# Patient Record
Sex: Female | Born: 1935
Health system: Southern US, Community
[De-identification: ages and names within clinical notes are randomized; demographics above are authoritative.]

## PROBLEM LIST (undated history)

## (undated) ENCOUNTER — Ambulatory Visit: Admission: EM | Payer: BLUE CROSS/BLUE SHIELD

## (undated) DIAGNOSIS — E119 Type 2 diabetes mellitus without complications: Secondary | ICD-10-CM

## (undated) DIAGNOSIS — E7800 Pure hypercholesterolemia, unspecified: Secondary | ICD-10-CM

## (undated) HISTORY — PX: ABDOMINAL HYSTERECTOMY: SHX81

---

## 1998-09-18 ENCOUNTER — Other Ambulatory Visit: Admission: RE | Admit: 1998-09-18 | Discharge: 1998-09-18 | Payer: Self-pay | Admitting: Obstetrics and Gynecology

## 2000-11-05 ENCOUNTER — Other Ambulatory Visit: Admission: RE | Admit: 2000-11-05 | Discharge: 2000-11-05 | Payer: Self-pay | Admitting: Obstetrics and Gynecology

## 2000-11-25 ENCOUNTER — Encounter: Admission: RE | Admit: 2000-11-25 | Discharge: 2000-11-25 | Payer: Self-pay | Admitting: General Surgery

## 2000-11-25 ENCOUNTER — Encounter (HOSPITAL_BASED_OUTPATIENT_CLINIC_OR_DEPARTMENT_OTHER): Payer: Self-pay | Admitting: General Surgery

## 2000-12-08 ENCOUNTER — Encounter: Admission: RE | Admit: 2000-12-08 | Discharge: 2001-03-08 | Payer: Self-pay | Admitting: Obstetrics and Gynecology

## 2001-02-03 ENCOUNTER — Ambulatory Visit (HOSPITAL_COMMUNITY): Admission: RE | Admit: 2001-02-03 | Discharge: 2001-02-03 | Payer: Self-pay | Admitting: Gastroenterology

## 2002-01-11 ENCOUNTER — Encounter: Admission: RE | Admit: 2002-01-11 | Discharge: 2002-01-11 | Payer: Self-pay | Admitting: Obstetrics and Gynecology

## 2002-01-11 ENCOUNTER — Encounter: Payer: Self-pay | Admitting: Obstetrics and Gynecology

## 2002-02-09 ENCOUNTER — Other Ambulatory Visit: Admission: RE | Admit: 2002-02-09 | Discharge: 2002-02-09 | Payer: Self-pay | Admitting: Obstetrics and Gynecology

## 2002-06-20 ENCOUNTER — Encounter: Payer: Self-pay | Admitting: Emergency Medicine

## 2002-06-20 ENCOUNTER — Inpatient Hospital Stay (HOSPITAL_COMMUNITY): Admission: EM | Admit: 2002-06-20 | Discharge: 2002-06-21 | Payer: Self-pay | Admitting: Cardiology

## 2003-03-28 ENCOUNTER — Encounter: Admission: RE | Admit: 2003-03-28 | Discharge: 2003-03-28 | Payer: Self-pay | Admitting: Unknown Physician Specialty

## 2003-03-28 ENCOUNTER — Encounter: Payer: Self-pay | Admitting: Unknown Physician Specialty

## 2004-03-21 ENCOUNTER — Encounter: Admission: RE | Admit: 2004-03-21 | Discharge: 2004-03-21 | Payer: Self-pay | Admitting: Internal Medicine

## 2004-04-01 ENCOUNTER — Encounter: Admission: RE | Admit: 2004-04-01 | Discharge: 2004-04-01 | Payer: Self-pay | Admitting: Internal Medicine

## 2005-04-01 ENCOUNTER — Other Ambulatory Visit: Admission: RE | Admit: 2005-04-01 | Discharge: 2005-04-01 | Payer: Self-pay | Admitting: Internal Medicine

## 2005-05-20 ENCOUNTER — Encounter: Admission: RE | Admit: 2005-05-20 | Discharge: 2005-05-20 | Payer: Self-pay | Admitting: Internal Medicine

## 2006-07-01 ENCOUNTER — Encounter: Admission: RE | Admit: 2006-07-01 | Discharge: 2006-07-01 | Payer: Self-pay | Admitting: Internal Medicine

## 2007-08-19 ENCOUNTER — Encounter: Admission: RE | Admit: 2007-08-19 | Discharge: 2007-08-19 | Payer: Self-pay | Admitting: Internal Medicine

## 2007-09-18 ENCOUNTER — Emergency Department (HOSPITAL_COMMUNITY): Admission: EM | Admit: 2007-09-18 | Discharge: 2007-09-18 | Payer: Self-pay | Admitting: Emergency Medicine

## 2008-09-11 ENCOUNTER — Encounter: Admission: RE | Admit: 2008-09-11 | Discharge: 2008-09-11 | Payer: Self-pay | Admitting: Internal Medicine

## 2008-10-30 ENCOUNTER — Encounter (HOSPITAL_COMMUNITY): Admission: RE | Admit: 2008-10-30 | Discharge: 2009-01-28 | Payer: Self-pay | Admitting: Internal Medicine

## 2009-09-19 ENCOUNTER — Encounter: Admission: RE | Admit: 2009-09-19 | Discharge: 2009-09-19 | Payer: Self-pay | Admitting: Internal Medicine

## 2010-07-13 ENCOUNTER — Encounter: Payer: Self-pay | Admitting: Family Medicine

## 2010-10-08 ENCOUNTER — Other Ambulatory Visit: Payer: Self-pay | Admitting: Internal Medicine

## 2010-10-08 DIAGNOSIS — R928 Other abnormal and inconclusive findings on diagnostic imaging of breast: Secondary | ICD-10-CM

## 2010-10-08 DIAGNOSIS — Z1231 Encounter for screening mammogram for malignant neoplasm of breast: Secondary | ICD-10-CM

## 2010-10-23 ENCOUNTER — Ambulatory Visit
Admission: RE | Admit: 2010-10-23 | Discharge: 2010-10-23 | Disposition: A | Discharge status: Home / Self Care | Payer: Medicare Other | Source: Ambulatory Visit | Attending: Internal Medicine | Admitting: Internal Medicine

## 2010-10-23 DIAGNOSIS — Z1231 Encounter for screening mammogram for malignant neoplasm of breast: Secondary | ICD-10-CM

## 2010-11-08 NOTE — Cardiovascular Report (Signed)
   NAME:  Megan Wolfe, Megan Wolfe                      ACCOUNT NO.:  [id number]   MEDICAL RECORD NO.:  [id number]                   PATIENT TYPE:  INP   LOCATION:  2926                                 FACILITY:  [facility]   PHYSICIAN:  Salvadore Farber, M.D. [facility]         DATE OF BIRTH:  11-10-1935   DATE OF PROCEDURE:  06/20/2002  DATE OF DISCHARGE:                              CARDIAC CATHETERIZATION   PROCEDURE:  Left heart catheterization, left ventriculography and coronary  angiography.   INDICATIONS FOR PROCEDURE:  The patient is a 75 year-old lady without a  history of prior cardiac disease who presented to Urgent Care today with a  complaint of chest pain.  Electrocardiogram demonstrated anterolateral ST  elevations.  She was transferred to [facility] where she was  treated with nitroglycerin and heparin with relief of her chest pain and ST  elevations.  She has remained pain free since then.  She was transferred for  urgent diagnostic angiography with possible intervention.   DIAGNOSTIC TECHNIQUE:  Informed consent was obtained.  Under 1% lidocaine  local anesthesia, a 6 French sheath was placed in the right femoral artery  using the modified Seldinger technique.  Diagnostic angiography and  ventriculography were performed using JL4, JR4 and straight pigtail  catheters.  The patient tolerated the procedure well and was transferred to  the holding room in stable condition.  She is to have the sheath removed  there.   COMPLICATIONS:  None.   FINDINGS:  1. Left ventricle:  Ejection fraction 65% without regional wall motion     abnormality.  137/8/16 after the administration of contrast.  2. No aortic stenosis or mitral regurgitation.  3. Left main:  Angiographically normal.  4. Left anterior descending:  Moderate sized vessel giving rise to a single     diagonal branch.  It is angiographically normal.  5. Circumflex:  Large vessel giving rise to two principal obtuse  marginal     branches.  It is angiographically normal.  6. Right coronary artery:  Dominant vessel which is angiographically normal.   IMPRESSION/PLAN:  Angiographically normal coronary arteries.  Normal left  ventricular size and systolic function.  The chest pain and transient ST  elevations are probably explained by coronary arterial spasm.  We will  therefore initiate a calcium channel blocker and chronic nitrates.  Serial  enzymes will be cycled.                                               Salvadore Farber, M.D. [facility]    WED/MEDQ  D:  06/20/2002  T:  06/20/2002  Job:  [id number]

## 2010-11-08 NOTE — Procedures (Signed)
[facility]  Patient:    Megan Wolfe, Megan Wolfe                   MRN: [id number] Proc. Date: 02/03/01 Adm. Date:  [id number] Attending:  Charna Elizabeth CC:         Sheronette A. Cherly Hensen, M.D.   Procedure Report  DATE OF BIRTH:  1936/02/22.  PROCEDURE:  Colonoscopy.  ENDOSCOPIST:  Anselmo Rod, M.D.  INSTRUMENT USED:  Olympus video colonoscope.  INDICATION FOR PROCEDURE:  Blood in stool with a history of constipation in a 75 year old African-American female.  Rule out colonic polyps, masses, hemorrhoids, etc.  PREPROCEDURE PREPARATION:  Informed consent was procured from the patient. The patient was fasted for eight hours prior to the procedure and prepped with a bottle of magnesium citrate and a gallon of NuLytely the night prior to the procedure.  PREPROCEDURE PHYSICAL:  VITAL SIGNS:  The patient had stable vital signs.  NECK:  Supple.  CHEST:  Clear to auscultation.  S1, S2 regular.  ABDOMEN:  Soft with normal bowel sounds.  DESCRIPTION OF PROCEDURE:  The patient was placed in the left lateral decubitus position and sedated with 50 mg of Demerol and 5 mg of Versed intravenously.  Once the patient was adequately sedate and maintained on low-flow oxygen and continuous cardiac monitoring, the Olympus video colonoscope was advanced from the rectum to the cecum.  Except for a few left-sided diverticula and melanosis coli throughout the colon with more prominent change in the right colon, no other abnormalities were seen.  No masses or polyps were present.  The patient tolerated the procedure well without complications.  IMPRESSION: 1. Left-sided diverticulosis. 2. Diffuse melanosis coli in the right colon, transverse colon, and    proximal left colon. 3. No masses or polyps seen.  RECOMMENDATIONS: 1. High-fiber diet. 2. Outpatient follow-up in the next four weeks. DD:  02/03/01 TD:  02/03/01 Job: [id number]

## 2010-11-08 NOTE — Discharge Summary (Signed)
NAME:  Megan Wolfe, Megan Wolfe                      ACCOUNT NO.:  [id number]   MEDICAL RECORD NO.:  [id number]                   PATIENT TYPE:  INP   LOCATION:  2033                                 FACILITY:  [facility]   PHYSICIAN:  Joellyn Rued, P.A. [facility]              DATE OF BIRTH:  1935/06/25   DATE OF ADMISSION:  06/20/2002  DATE OF DISCHARGE:                           DISCHARGE SUMMARY - REFERRING   SUMMARY OF HISTORY:  [name] is a 75 year old, black female who went  to [facility] because she was having intermittent chest discomfort for the  preceding four days.  She was sent to [facility] where an EKG revealed ST  segment elevation laterally and T-wave changes in the anterior leads.  These  changes resolved with Heparin and nitroglycerin.  Her history is notable for  history of hyperlipidemia, currently untreated.   LABORATORY DATA:  Admission H&H was 13.4 and 39.1, normal indices.  Platelets 239, WBC 7.4.  On the 30th, H&H were 11.6 and 34.0, normal  indices, platelets 252, WBC 4.5.  On the 29th, sodium was 141, potassium  4.2, BUN 15, creatinine 1.0, glucose 114.  On the 30th, essentially remained  unchanged.  Serial of four CKs and three troponins were negative for  myocardial infarction, BMP was 44.2.  EKG showed sinus bradycardia,  nonspecific T-wave changes.  She does have approximately 0.5 to 1 mm ST  segment elevation in V4 through V6, this is possibly baseline for her.   HOSPITAL COURSE:  [name] was taken emergently to the catheterization  laboratory for possible myocardial infarction.  Dr. Samule Ohm performed left  heart cath via the right femoral artery on 06/20/02.  Cardiac  catheterization did not show any coronary artery disease.  Her EF was 65%  without wall motion abnormality.  She did not have any AF or MR.  Dr. Samule Ohm  notes that she has possible spasm accounting for chest discomfort and ST  wave changes.  Her sheath remained in with bed rest,  catheterization site  was intact.  Serial CK titer and MBs were negative for myocardial  infarction.  After review on 12/30, Dr. Andee Lineman felt that she could be  discharged home and treated for spasm.   DISCHARGE DIAGNOSES:  1. Chest discomfort, possibly related to coronary spasm.  2. Normocytic anemia, post catheterization.  3. History as previously.   DISPOSITION:  She is discharged to home.  Her new medications include  Cardizem CD 120 q.d., Imdur 30 q.d., aspirin 81 q.d. and sublingual  nitroglycerin, 0.4 as needed.  She is advised no lifting, driving, sexual  activity or heavy exertion for two days.  Maintain low salt, fat,  cholesterol diet.  If she has any problems with her catheterization site,  she was asked to call.  She will see Dr. Andee Lineman back in the [facility]  office on January 20 at 2:00 p.m.  Joellyn Rued, P.A. [facility]    EW/MEDQ  D:  06/21/2002  T:  06/21/2002  Job:  [id number]   cc:   Learta Codding, M.D. [facility]

## 2010-11-08 NOTE — H&P (Signed)
NAME:  Megan Wolfe, Megan Wolfe                      ACCOUNT NO.:  [id number]   MEDICAL RECORD NO.:  [id number]                   PATIENT TYPE:  INP   LOCATION:  2926                                 FACILITY:  [facility]   PHYSICIAN:  Learta Codding, M.D. [facility]             DATE OF BIRTH:  Nov 02, 1935   DATE OF ADMISSION:  06/20/2002  DATE OF DISCHARGE:                                HISTORY & PHYSICAL   PRIMARY CARE PHYSICIAN:  [facility] on [address]   CURRENT COMPLAINTS:  Substernal chest pain since Friday.   HISTORY OF PRESENT ILLNESS:  Megan Wolfe is a 75 year old African American  female with a history of hyperlipidemia and hypertension and was admitted  with the onset of substernal chest pain.  The patient reports chest pain  since last Friday.  She initially reported it was left sided with tight  sensation.  There was no associated shortness of breath, diaphoresis or  nausea.  However yesterday, particularly with exertion, the patient felt  pain radiating down the left arm.  Today she had longer episodes of  substernal chest pain and left arm pain and presents to the emergency room.  She was found to have S-T segment changes on the 12 channel  electrocardiogram particularly S-T elevation in leads V2, V3, V4, V5 and V6.  After nitroglycerin some of these changes resolved particularly leads V2 and  V3 and there were still changes in V4, V5 and V6.  These appeared to  represent early repolarization.  The patient is currently pain free.  Initial troponin was within normal limits.   ALLERGIES:  NO KNOWN DRUG ALLERGIES.   PAST MEDICAL HISTORY:  No prior cardiac history, no diabetes, no underlying  lung disease.   SOCIAL HISTORY:  Documented in PA notes.  The patient does not smoke.  She  does not drink alcohol.   FAMILY HISTORY:  Documented in PA notes.   REVIEW OF SYSTEMS:  As per History of Present Illness no nausea or vomiting,  no fever, chills, no orthopnea, paroxysmal  nocturnal dyspnea, no  palpitations or syncope, no dysuria or frequency.   PHYSICAL EXAMINATION:  VITAL SIGNS:  Blood pressure 137/84, heart rate 110  beats per minute, now 57 beats per minute, saturation 99%.  GENERAL:  Well-nourished African American female in no apparent distress.  HEENT:  Pupils ___________, conjunctiva clear.  NECK:  Supple, normal clear upstroke, no carotid bruits.  LUNGS:  Crackles bilaterally approximately one third up, but more sounding  like Velcro rales.  HEART:  Regular rate and rhythm, normal S1, S2, no murmurs, rubs or gallops.  ABDOMEN:  Soft, nontender, no rebound or guarding.  EXTREMITIES:  Two plus pedal pulses, there is no cyanosis, clubbing or  edema.  NEUROLOGIC:  Patient alert and oriented, grossly nonfocal.   LABORATORY DATA:  Sodium 141, potassium 4.4, chloride 112, glucose 114, BUN  15, creatinine 1.0, CPK is 237, troponin  0.02, white count 7.4, hemoglobin  13.4, hematocrit 39.1, ___________ 239.  INR pending.  Chest x-ray - mild  cardiomegaly, increased interstitial changes probably interstitial lung  disease, cannot rule out pulmonary vascular congestion.   IMPRESSION:  1. Substernal chest pain with typical features for acute coronary syndrome.     The patient's pain was associated with S-T segment changes in the     inferior precordial leads and resolved with nitroglycerin.  She was given     aspirin and Plavix as well as IV heparin.  The plan is to proceed with     early cardiac catheterization which will probably be done later today.     The patient will also be started on very low dose beta blocker.  2. Rule out hyperlipidemia.  Lipid panel will be drawn.  3. Rule out metabolic _____________, hemoglobin A1c will be checked.  4. Cardiac catheterization later today.  5. Rule out interstitial lung disease.  Check basic metabolic profile and     repeat chest x-ray, PA and lateral prior  to hospital discharge.  I do     not think patient is  in congestive heart failure but she does have, on     examination, evidence of Velcro rales and could have underlying lung     disease.                                               Learta Codding, M.D. [facility]    GED/MEDQ  D:  06/20/2002  T:  06/20/2002  Job:  [id number]

## 2011-07-14 DIAGNOSIS — K5730 Diverticulosis of large intestine without perforation or abscess without bleeding: Secondary | ICD-10-CM | POA: Diagnosis not present

## 2011-07-14 DIAGNOSIS — Z1211 Encounter for screening for malignant neoplasm of colon: Secondary | ICD-10-CM | POA: Diagnosis not present

## 2011-07-14 DIAGNOSIS — D126 Benign neoplasm of colon, unspecified: Secondary | ICD-10-CM | POA: Diagnosis not present

## 2011-07-16 DIAGNOSIS — J309 Allergic rhinitis, unspecified: Secondary | ICD-10-CM | POA: Diagnosis not present

## 2011-11-26 DIAGNOSIS — M810 Age-related osteoporosis without current pathological fracture: Secondary | ICD-10-CM | POA: Diagnosis not present

## 2011-11-26 DIAGNOSIS — E782 Mixed hyperlipidemia: Secondary | ICD-10-CM | POA: Diagnosis not present

## 2012-01-14 ENCOUNTER — Other Ambulatory Visit: Payer: Self-pay | Admitting: Internal Medicine

## 2012-01-14 DIAGNOSIS — Z1231 Encounter for screening mammogram for malignant neoplasm of breast: Secondary | ICD-10-CM

## 2012-01-29 ENCOUNTER — Ambulatory Visit
Admission: RE | Admit: 2012-01-29 | Discharge: 2012-01-29 | Disposition: A | Discharge status: Home / Self Care | Payer: 59 | Source: Ambulatory Visit | Attending: Internal Medicine | Admitting: Internal Medicine

## 2012-01-29 DIAGNOSIS — Z1231 Encounter for screening mammogram for malignant neoplasm of breast: Secondary | ICD-10-CM

## 2012-05-05 ENCOUNTER — Emergency Department (HOSPITAL_COMMUNITY)
Admission: EM | Admit: 2012-05-05 | Discharge: 2012-05-05 | Disposition: A | Discharge status: Home / Self Care | Payer: Medicare Other | Attending: Emergency Medicine | Admitting: Emergency Medicine

## 2012-05-05 ENCOUNTER — Emergency Department (HOSPITAL_COMMUNITY): Payer: Medicare Other

## 2012-05-05 ENCOUNTER — Encounter (HOSPITAL_COMMUNITY): Payer: Self-pay | Admitting: Emergency Medicine

## 2012-05-05 DIAGNOSIS — E119 Type 2 diabetes mellitus without complications: Secondary | ICD-10-CM | POA: Diagnosis not present

## 2012-05-05 DIAGNOSIS — Y929 Unspecified place or not applicable: Secondary | ICD-10-CM | POA: Insufficient documentation

## 2012-05-05 DIAGNOSIS — S62639A Displaced fracture of distal phalanx of unspecified finger, initial encounter for closed fracture: Secondary | ICD-10-CM | POA: Diagnosis not present

## 2012-05-05 DIAGNOSIS — Z79899 Other long term (current) drug therapy: Secondary | ICD-10-CM | POA: Insufficient documentation

## 2012-05-05 DIAGNOSIS — W230XXA Caught, crushed, jammed, or pinched between moving objects, initial encounter: Secondary | ICD-10-CM | POA: Insufficient documentation

## 2012-05-05 DIAGNOSIS — S6980XA Other specified injuries of unspecified wrist, hand and finger(s), initial encounter: Secondary | ICD-10-CM | POA: Diagnosis not present

## 2012-05-05 DIAGNOSIS — Y939 Activity, unspecified: Secondary | ICD-10-CM | POA: Insufficient documentation

## 2012-05-05 DIAGNOSIS — S6990XA Unspecified injury of unspecified wrist, hand and finger(s), initial encounter: Secondary | ICD-10-CM | POA: Diagnosis not present

## 2012-05-05 HISTORY — DX: Type 2 diabetes mellitus without complications: E11.9

## 2012-05-05 MED ORDER — ACETAMINOPHEN 500 MG PO TABS: 500.0000 mg | ORAL_TABLET | Freq: Four times a day (QID) | ORAL | Status: DC | PRN

## 2012-05-05 NOTE — ED Notes (Signed)
Patient transported to X-ray 

## 2012-05-05 NOTE — ED Notes (Signed)
Pt states that she jammed her left pinky finger last week and it has been throbbing at night ever since.  Pt states that she is not really in pain right now but during the night, it throbs.  Pt is able to move pinky finger freely with no problems.

## 2012-05-05 NOTE — ED Provider Notes (Signed)
Medical screening examination/treatment/procedure(s) were conducted as a shared visit with non-physician practitioner(s) and myself.  I personally evaluated the patient during the encounter  Finger placed in splint, referrals given  Toy Baker, MD 05/05/12 [phone]

## 2012-05-05 NOTE — ED Provider Notes (Signed)
History     CSN: [id number]  Arrival date & time 05/05/12  1345   First MD Initiated Contact with Patient 05/05/12 1512      Chief Complaint  Patient presents with  . Hand Pain    (Consider location/radiation/quality/duration/timing/severity/associated sxs/prior treatment) HPI  76 year old female with history of diabetes presents complaining of left pinky finger injury. Patient reports she accidentally jammed her left pinky finger 8-10 days ago.  Was having pain to the joint of finger which has improved.  Onset was acute, moderate in severity, non radiating, worsening with movement and improves with inactivity.  Pain worse at night when it throbs.  She has tried buddy taped it.  She is able move her finger but has noticed that she can't fully extend her finger.  Denies numbness, rash, or bleeding.  No other injury.    Past Medical History  Diagnosis Date  . Diabetes mellitus without complication     No past surgical history on file.  No family history on file.  History  Substance Use Topics  . Smoking status: Never Smoker   . Smokeless tobacco: Not on file  . Alcohol Use: No    OB History    Grav Para Term Preterm Abortions TAB SAB Ect Mult Living                  Review of Systems  Constitutional: Negative for fever.  Musculoskeletal: Positive for arthralgias.  Skin: Negative for rash and wound.  Neurological: Negative for numbness.    Allergies  Review of patient's allergies indicates no known allergies.  Home Medications   Current Outpatient Rx  Name  Route  Sig  Dispense  Refill  . APPLE CIDER VINEGAR PO   Oral   Take 2 tablets by mouth 2 (two) times daily.         Marland Kitchen METFORMIN HCL PO   Oral   Take 500 mg by mouth daily.         . RED YEAST RICE PO   Oral   Take 1 tablet by mouth daily.         . TETRAHYDROZOLINE HCL 0.05 % OP SOLN   Both Eyes   Place 1 drop into both eyes as needed. For dry eyes.           BP 140/73  Pulse 60   Temp 98.5 F (36.9 C) (Oral)  Resp 18  SpO2 100%  Physical Exam  Nursing note and vitals reviewed. Constitutional: She appears well-developed and well-nourished. No distress.  HENT:  Head: Atraumatic.  Eyes: Conjunctivae normal are normal.  Neck: Neck supple.  Musculoskeletal: She exhibits tenderness (L hand: left pinky finger with point tenderness to DIP.  Finger is mildly flexed at DIP.  No deformity noted.  Normal finger flexion but decreased extension.  Brisk cap refill, normal sensation to light touch).  Neurological: She is alert.  Skin: Skin is warm. No rash noted.  Psychiatric: She has a normal mood and affect.    ED Course  Procedures (including critical care time)   No results found for this or any previous visit. Dg Finger Little Left  05/05/2012  *RADIOLOGY REPORT*  Clinical Data: Jammed finger one half weeks ago  LEFT LITTLE FINGER 2+V  Comparison: None.  Findings: There is an avulsion fracture from the posterior aspect of the fifth distal phalanx with mild displacement.  No other fracture is seen.  No significant soft tissue abnormality is noted.  IMPRESSION: Avulsion fracture  from the fifth distal phalanx.   Original Report Authenticated By: Alcide Clever, M.D.     1. Avulsion fx of the fifth distal phalanx on left hand  MDM  Pt jammed her L pinky finger over a week ago.  Pt presents with what appears to be a swan neck deformity concerns for extensor tendon injury vs. fx.  Will order xray.  Otherwise no significant pain.     3:51 PM Xray shows an avulsion fx of the fifth distal phalanx on L hand.  Will apply finger splint, hand surgery referral.  Discussed care with my attending.     BP 140/73  Pulse 60  Temp 98.5 F (36.9 C) (Oral)  Resp 18  SpO2 100%  I have reviewed nursing notes and vital signs. I personally reviewed the imaging tests through PACS system  I reviewed available ER/hospitalization records thought the EMR    Fayrene Helper, [place] 05/05/12  1611

## 2012-05-06 NOTE — ED Provider Notes (Signed)
Medical screening examination/treatment/procedure(s) were performed by non-physician practitioner and as supervising physician I was immediately available for consultation/collaboration.  Toy Baker, MD 05/06/12 [phone]

## 2012-05-07 DIAGNOSIS — S62639A Displaced fracture of distal phalanx of unspecified finger, initial encounter for closed fracture: Secondary | ICD-10-CM | POA: Diagnosis not present

## 2012-05-11 DIAGNOSIS — S62639A Displaced fracture of distal phalanx of unspecified finger, initial encounter for closed fracture: Secondary | ICD-10-CM | POA: Diagnosis not present

## 2012-05-13 DIAGNOSIS — Z961 Presence of intraocular lens: Secondary | ICD-10-CM | POA: Diagnosis not present

## 2012-05-14 DIAGNOSIS — M26629 Arthralgia of temporomandibular joint, unspecified side: Secondary | ICD-10-CM | POA: Diagnosis not present

## 2012-05-24 DIAGNOSIS — S62639A Displaced fracture of distal phalanx of unspecified finger, initial encounter for closed fracture: Secondary | ICD-10-CM | POA: Diagnosis not present

## 2012-05-27 DIAGNOSIS — E119 Type 2 diabetes mellitus without complications: Secondary | ICD-10-CM | POA: Diagnosis not present

## 2012-05-27 DIAGNOSIS — E782 Mixed hyperlipidemia: Secondary | ICD-10-CM | POA: Diagnosis not present

## 2012-05-27 DIAGNOSIS — Z0000 Encounter for general adult medical examination without abnormal findings: Secondary | ICD-10-CM | POA: Diagnosis not present

## 2012-05-27 DIAGNOSIS — M810 Age-related osteoporosis without current pathological fracture: Secondary | ICD-10-CM | POA: Diagnosis not present

## 2012-05-27 DIAGNOSIS — Z1331 Encounter for screening for depression: Secondary | ICD-10-CM | POA: Diagnosis not present

## 2012-05-31 DIAGNOSIS — S62639A Displaced fracture of distal phalanx of unspecified finger, initial encounter for closed fracture: Secondary | ICD-10-CM | POA: Diagnosis not present

## 2012-06-14 DIAGNOSIS — S62639A Displaced fracture of distal phalanx of unspecified finger, initial encounter for closed fracture: Secondary | ICD-10-CM | POA: Diagnosis not present

## 2012-06-30 DIAGNOSIS — S62639A Displaced fracture of distal phalanx of unspecified finger, initial encounter for closed fracture: Secondary | ICD-10-CM | POA: Diagnosis not present

## 2012-07-12 DIAGNOSIS — E782 Mixed hyperlipidemia: Secondary | ICD-10-CM | POA: Diagnosis not present

## 2012-07-28 DIAGNOSIS — S62639A Displaced fracture of distal phalanx of unspecified finger, initial encounter for closed fracture: Secondary | ICD-10-CM | POA: Diagnosis not present

## 2012-07-29 DIAGNOSIS — M949 Disorder of cartilage, unspecified: Secondary | ICD-10-CM | POA: Diagnosis not present

## 2012-11-25 DIAGNOSIS — K1370 Unspecified lesions of oral mucosa: Secondary | ICD-10-CM | POA: Diagnosis not present

## 2012-11-25 DIAGNOSIS — E119 Type 2 diabetes mellitus without complications: Secondary | ICD-10-CM | POA: Diagnosis not present

## 2012-11-25 DIAGNOSIS — E782 Mixed hyperlipidemia: Secondary | ICD-10-CM | POA: Diagnosis not present

## 2013-02-01 DIAGNOSIS — I6529 Occlusion and stenosis of unspecified carotid artery: Secondary | ICD-10-CM | POA: Diagnosis not present

## 2013-02-01 DIAGNOSIS — R6884 Jaw pain: Secondary | ICD-10-CM | POA: Diagnosis not present

## 2013-02-11 DIAGNOSIS — I6529 Occlusion and stenosis of unspecified carotid artery: Secondary | ICD-10-CM | POA: Diagnosis not present

## 2013-03-03 ENCOUNTER — Other Ambulatory Visit: Payer: Self-pay

## 2013-03-03 DIAGNOSIS — Z1231 Encounter for screening mammogram for malignant neoplasm of breast: Secondary | ICD-10-CM

## 2013-03-23 DIAGNOSIS — J069 Acute upper respiratory infection, unspecified: Secondary | ICD-10-CM | POA: Diagnosis not present

## 2013-03-23 DIAGNOSIS — J3489 Other specified disorders of nose and nasal sinuses: Secondary | ICD-10-CM | POA: Diagnosis not present

## 2013-03-30 ENCOUNTER — Ambulatory Visit
Admission: RE | Admit: 2013-03-30 | Discharge: 2013-03-30 | Disposition: A | Discharge status: Home / Self Care | Payer: Medicare Other | Source: Ambulatory Visit

## 2013-03-30 DIAGNOSIS — Z1231 Encounter for screening mammogram for malignant neoplasm of breast: Secondary | ICD-10-CM

## 2013-05-27 DIAGNOSIS — E119 Type 2 diabetes mellitus without complications: Secondary | ICD-10-CM | POA: Diagnosis not present

## 2013-05-27 DIAGNOSIS — Z961 Presence of intraocular lens: Secondary | ICD-10-CM | POA: Diagnosis not present

## 2013-06-01 DIAGNOSIS — Z0000 Encounter for general adult medical examination without abnormal findings: Secondary | ICD-10-CM | POA: Diagnosis not present

## 2013-06-01 DIAGNOSIS — Z23 Encounter for immunization: Secondary | ICD-10-CM | POA: Diagnosis not present

## 2013-06-01 DIAGNOSIS — E782 Mixed hyperlipidemia: Secondary | ICD-10-CM | POA: Diagnosis not present

## 2013-06-01 DIAGNOSIS — M810 Age-related osteoporosis without current pathological fracture: Secondary | ICD-10-CM | POA: Diagnosis not present

## 2013-06-01 DIAGNOSIS — Z1331 Encounter for screening for depression: Secondary | ICD-10-CM | POA: Diagnosis not present

## 2013-11-30 DIAGNOSIS — E782 Mixed hyperlipidemia: Secondary | ICD-10-CM | POA: Diagnosis not present

## 2013-11-30 DIAGNOSIS — IMO0001 Reserved for inherently not codable concepts without codable children: Secondary | ICD-10-CM | POA: Diagnosis not present

## 2014-04-05 DIAGNOSIS — H00021 Hordeolum internum right upper eyelid: Secondary | ICD-10-CM | POA: Diagnosis not present

## 2014-05-25 ENCOUNTER — Other Ambulatory Visit: Payer: Self-pay

## 2014-05-25 DIAGNOSIS — Z1231 Encounter for screening mammogram for malignant neoplasm of breast: Secondary | ICD-10-CM

## 2014-06-05 DIAGNOSIS — Z23 Encounter for immunization: Secondary | ICD-10-CM | POA: Diagnosis not present

## 2014-06-05 DIAGNOSIS — M810 Age-related osteoporosis without current pathological fracture: Secondary | ICD-10-CM | POA: Diagnosis not present

## 2014-06-05 DIAGNOSIS — E780 Pure hypercholesterolemia: Secondary | ICD-10-CM | POA: Diagnosis not present

## 2014-06-05 DIAGNOSIS — E119 Type 2 diabetes mellitus without complications: Secondary | ICD-10-CM | POA: Diagnosis not present

## 2014-06-05 DIAGNOSIS — Z0000 Encounter for general adult medical examination without abnormal findings: Secondary | ICD-10-CM | POA: Diagnosis not present

## 2014-06-05 DIAGNOSIS — Z1389 Encounter for screening for other disorder: Secondary | ICD-10-CM | POA: Diagnosis not present

## 2014-06-09 ENCOUNTER — Ambulatory Visit: Payer: Medicare Other

## 2014-06-27 ENCOUNTER — Ambulatory Visit
Admission: RE | Admit: 2014-06-27 | Discharge: 2014-06-27 | Disposition: A | Discharge status: Home / Self Care | Payer: Medicare Other | Source: Ambulatory Visit

## 2014-06-27 DIAGNOSIS — Z1231 Encounter for screening mammogram for malignant neoplasm of breast: Secondary | ICD-10-CM

## 2014-07-03 DIAGNOSIS — E119 Type 2 diabetes mellitus without complications: Secondary | ICD-10-CM | POA: Diagnosis not present

## 2014-07-03 DIAGNOSIS — Z961 Presence of intraocular lens: Secondary | ICD-10-CM | POA: Diagnosis not present

## 2014-12-04 DIAGNOSIS — Z1211 Encounter for screening for malignant neoplasm of colon: Secondary | ICD-10-CM | POA: Diagnosis not present

## 2014-12-11 DIAGNOSIS — E782 Mixed hyperlipidemia: Secondary | ICD-10-CM | POA: Diagnosis not present

## 2014-12-11 DIAGNOSIS — M810 Age-related osteoporosis without current pathological fracture: Secondary | ICD-10-CM | POA: Diagnosis not present

## 2014-12-11 DIAGNOSIS — R05 Cough: Secondary | ICD-10-CM | POA: Diagnosis not present

## 2014-12-11 DIAGNOSIS — E1165 Type 2 diabetes mellitus with hyperglycemia: Secondary | ICD-10-CM | POA: Diagnosis not present

## 2014-12-11 DIAGNOSIS — E119 Type 2 diabetes mellitus without complications: Secondary | ICD-10-CM | POA: Diagnosis not present

## 2015-01-08 DIAGNOSIS — M8589 Other specified disorders of bone density and structure, multiple sites: Secondary | ICD-10-CM | POA: Diagnosis not present

## 2015-01-08 DIAGNOSIS — M899 Disorder of bone, unspecified: Secondary | ICD-10-CM | POA: Diagnosis not present

## 2015-03-27 DIAGNOSIS — E1165 Type 2 diabetes mellitus with hyperglycemia: Secondary | ICD-10-CM | POA: Diagnosis not present

## 2015-06-27 DIAGNOSIS — H00024 Hordeolum internum left upper eyelid: Secondary | ICD-10-CM | POA: Diagnosis not present

## 2015-07-30 DIAGNOSIS — E119 Type 2 diabetes mellitus without complications: Secondary | ICD-10-CM | POA: Diagnosis not present

## 2015-07-30 DIAGNOSIS — Z961 Presence of intraocular lens: Secondary | ICD-10-CM | POA: Diagnosis not present

## 2015-07-31 DIAGNOSIS — Z0000 Encounter for general adult medical examination without abnormal findings: Secondary | ICD-10-CM | POA: Diagnosis not present

## 2015-07-31 DIAGNOSIS — E119 Type 2 diabetes mellitus without complications: Secondary | ICD-10-CM | POA: Diagnosis not present

## 2015-07-31 DIAGNOSIS — Z1389 Encounter for screening for other disorder: Secondary | ICD-10-CM | POA: Diagnosis not present

## 2015-07-31 DIAGNOSIS — E782 Mixed hyperlipidemia: Secondary | ICD-10-CM | POA: Diagnosis not present

## 2015-07-31 DIAGNOSIS — M810 Age-related osteoporosis without current pathological fracture: Secondary | ICD-10-CM | POA: Diagnosis not present

## 2015-07-31 DIAGNOSIS — Z7984 Long term (current) use of oral hypoglycemic drugs: Secondary | ICD-10-CM | POA: Diagnosis not present

## 2015-08-23 ENCOUNTER — Other Ambulatory Visit: Payer: Self-pay

## 2015-08-23 DIAGNOSIS — Z1231 Encounter for screening mammogram for malignant neoplasm of breast: Secondary | ICD-10-CM

## 2015-09-05 ENCOUNTER — Ambulatory Visit
Admission: RE | Admit: 2015-09-05 | Discharge: 2015-09-05 | Disposition: A | Discharge status: Home / Self Care | Payer: Medicare Other | Source: Ambulatory Visit

## 2015-09-05 DIAGNOSIS — Z1231 Encounter for screening mammogram for malignant neoplasm of breast: Secondary | ICD-10-CM

## 2016-01-29 DIAGNOSIS — E119 Type 2 diabetes mellitus without complications: Secondary | ICD-10-CM | POA: Diagnosis not present

## 2016-01-29 DIAGNOSIS — Z7984 Long term (current) use of oral hypoglycemic drugs: Secondary | ICD-10-CM | POA: Diagnosis not present

## 2016-01-29 DIAGNOSIS — E782 Mixed hyperlipidemia: Secondary | ICD-10-CM | POA: Diagnosis not present

## 2016-07-14 DIAGNOSIS — R229 Localized swelling, mass and lump, unspecified: Secondary | ICD-10-CM | POA: Diagnosis not present

## 2016-07-23 DIAGNOSIS — L309 Dermatitis, unspecified: Secondary | ICD-10-CM | POA: Diagnosis not present

## 2016-07-23 DIAGNOSIS — S50861A Insect bite (nonvenomous) of right forearm, initial encounter: Secondary | ICD-10-CM | POA: Diagnosis not present

## 2016-08-12 DIAGNOSIS — L299 Pruritus, unspecified: Secondary | ICD-10-CM | POA: Diagnosis not present

## 2016-08-12 DIAGNOSIS — E782 Mixed hyperlipidemia: Secondary | ICD-10-CM | POA: Diagnosis not present

## 2016-08-12 DIAGNOSIS — M810 Age-related osteoporosis without current pathological fracture: Secondary | ICD-10-CM | POA: Diagnosis not present

## 2016-08-12 DIAGNOSIS — R21 Rash and other nonspecific skin eruption: Secondary | ICD-10-CM | POA: Diagnosis not present

## 2016-08-12 DIAGNOSIS — Z0000 Encounter for general adult medical examination without abnormal findings: Secondary | ICD-10-CM | POA: Diagnosis not present

## 2016-08-12 DIAGNOSIS — Z7189 Other specified counseling: Secondary | ICD-10-CM | POA: Diagnosis not present

## 2016-08-12 DIAGNOSIS — Z1389 Encounter for screening for other disorder: Secondary | ICD-10-CM | POA: Diagnosis not present

## 2016-08-12 DIAGNOSIS — E119 Type 2 diabetes mellitus without complications: Secondary | ICD-10-CM | POA: Diagnosis not present

## 2016-08-12 DIAGNOSIS — Z7984 Long term (current) use of oral hypoglycemic drugs: Secondary | ICD-10-CM | POA: Diagnosis not present

## 2016-10-22 ENCOUNTER — Encounter: Payer: Self-pay | Admitting: Family Medicine

## 2016-10-22 ENCOUNTER — Ambulatory Visit (INDEPENDENT_AMBULATORY_CARE_PROVIDER_SITE_OTHER): Payer: Medicare Other | Admitting: Family Medicine

## 2016-10-22 VITALS — BP 138/75 | HR 73 | Temp 98.9°F | Resp 16 | Ht 64.0 in | Wt 138.0 lb

## 2016-10-22 DIAGNOSIS — E119 Type 2 diabetes mellitus without complications: Secondary | ICD-10-CM

## 2016-10-22 DIAGNOSIS — E118 Type 2 diabetes mellitus with unspecified complications: Secondary | ICD-10-CM | POA: Insufficient documentation

## 2016-10-22 DIAGNOSIS — L509 Urticaria, unspecified: Secondary | ICD-10-CM | POA: Diagnosis not present

## 2016-10-22 LAB — POCT GLYCOSYLATED HEMOGLOBIN (HGB A1C): HEMOGLOBIN A1C: 8.7

## 2016-10-22 MED ORDER — GLUCOSE BLOOD VI STRP: ORAL_STRIP | 12 refills | Status: DC

## 2016-10-22 NOTE — Patient Instructions (Addendum)
   IF you received an x-ray today, you will receive an invoice from [facility]. Please contact [facility] at [phone] with questions or concerns regarding your invoice.   IF you received labwork today, you will receive an invoice from [facility]. Please contact [facility] at [phone] with questions or concerns regarding your invoice.   Our billing staff will not be able to assist you with questions regarding bills from these companies.  You will be contacted with the lab results as soon as they are available. The fastest way to get your results is to activate your My Chart account. Instructions are located on the last page of this paperwork. If you have not heard from us regarding the results in 2 weeks, please contact this office.     Blood Glucose Monitoring, Adult Monitoring your blood sugar (glucose) helps you manage your diabetes. It also helps you and your health care provider determine how well your diabetes management plan is working. Blood glucose monitoring involves checking your blood glucose as often as directed, and keeping a record (log) of your results over time. Why should I monitor my blood glucose? Checking your blood glucose regularly can:  Help you understand how food, exercise, illnesses, and medicines affect your blood glucose.  Let you know what your blood glucose is at any time. You can quickly tell if you are having low blood glucose (hypoglycemia) or high blood glucose (hyperglycemia).  Help you and your health care provider adjust your medicines as needed.  When should I check my blood glucose? Follow instructions from your health care provider about how often to check your blood glucose. This may depend on:  The type of diabetes you have.  How well-controlled your diabetes is.  Medicines you are taking.  If you have type 1 diabetes:  Check your blood glucose at least 2 times a day.  Also check your blood glucose: ? Before  every insulin injection. ? Before and after exercise. ? Between meals. ? 2 hours after a meal. ? Occasionally between 2:00 a.m. and 3:00 a.m., as directed. ? Before potentially dangerous tasks, like driving or using heavy machinery. ? At bedtime.  You may need to check your blood glucose more often, up to 6-10 times a day: ? If you use an insulin pump. ? If you need multiple daily injections (MDI). ? If your diabetes is not well-controlled. ? If you are ill. ? If you have a history of severe hypoglycemia. ? If you have a history of not knowing when your blood glucose is getting low (hypoglycemia unawareness). If you have type 2 diabetes:  If you take insulin or other diabetes medicines, check your blood glucose at least 2 times a day.  If you are on intensive insulin therapy, check your blood glucose at least 4 times a day. Occasionally, you may also need to check between 2:00 a.m. and 3:00 a.m., as directed.  Also check your blood glucose: ? Before and after exercise. ? Before potentially dangerous tasks, like driving or using heavy machinery.  You may need to check your blood glucose more often if: ? Your medicine is being adjusted. ? Your diabetes is not well-controlled. ? You are ill. What is a blood glucose log?  A blood glucose log is a record of your blood glucose readings. It helps you and your health care provider: ? Look for patterns in your blood glucose over time. ? Adjust your diabetes management plan as needed.  Every time you check   your blood glucose, write down your result and notes about things that may be affecting your blood glucose, such as your diet and exercise for the day.  Most glucose meters store a record of glucose readings in the meter. Some meters allow you to download your records to a computer. How do I check my blood glucose? Follow these steps to get accurate readings of your blood glucose: Supplies needed   Blood glucose meter.  Test  strips for your meter. Each meter has its own strips. You must use the strips that come with your meter.  A needle to prick your finger (lancet). Do not use lancets more than once.  A device that holds the lancet (lancing device).  A journal or log book to write down your results. Procedure  Wash your hands with soap and water.  Prick the side of your finger (not the tip) with the lancet. Use a different finger each time.  Gently rub the finger until a small drop of blood appears.  Follow instructions that come with your meter for inserting the test strip, applying blood to the strip, and using your blood glucose meter.  Write down your result and any notes. Alternative testing sites  Some meters allow you to use areas of your body other than your finger (alternative sites) to test your blood.  If you think you may have hypoglycemia, or if you have hypoglycemia unawareness, do not use alternative sites. Use your finger instead.  Alternative sites may not be as accurate as the fingers, because blood flow is slower in these areas. This means that the result you get may be delayed, and it may be different from the result that you would get from your finger.  The most common alternative sites are: ? Forearm. ? Thigh. ? Palm of the hand. Additional tips  Always keep your supplies with you.  If you have questions or need help, all blood glucose meters have a 24-hour "hotline" number that you can call. You may also contact your health care provider.  After you use a few boxes of test strips, adjust (calibrate) your blood glucose meter by following instructions that came with your meter. This information is not intended to replace advice given to you by your health care provider. Make sure you discuss any questions you have with your health care provider. Document Released: 06/12/2003 Document Revised: 12/28/2015 Document Reviewed: 11/19/2015 Elsevier Interactive Patient Education   2017 [employer]  

## 2016-10-22 NOTE — Progress Notes (Signed)
Chief Complaint  Patient presents with  . Establish Care  . Diabetes    Pt does not take metformin as prescribed. she states it makes her itch    HPI   Diabetes Mellitus: Patient presents for follow up of diabetes. Symptoms: none. Symptoms have gradually improved. Patient denies foot ulcerations, hyperglycemia, hypoglycemia , increase appetite, nausea, paresthesia of the feet, polydipsia, polyuria and visual disturbances.  Evaluation to date has been included: fasting blood sugar and hemoglobin A1C.  Home sugars: patient does not check sugars. She does not check her sugars because she ran out of strips. Eye exam every six months Next eye exam in one week  She exercises by walking 2-3 days a week She avoids sugars and sweets  Lab Results  Component Value Date   HGBA1C 8.7 10/22/2016   Urticaria She reports that she stopped her metformin due to concerns about hives.   She is on tradjenta and glimepiride for her diabetes.  She reports that she has an intense itch with her hives. This has been going on for 6 weeks or more.  She was going to Dermatology here but she is getting a second opinion     Past Medical History:  Diagnosis Date  . Diabetes mellitus without complication (HCC)     Current Outpatient Prescriptions  Medication Sig Dispense Refill  . glimepiride (AMARYL) 2 MG tablet TAKE 1 TABLET WITH BREAKFAST OR FIRST MAIN MEAL OF THE DAY  6  . METFORMIN HCL PO Take 500 mg by mouth daily.    . Red Yeast Rice Extract (RED YEAST RICE PO) Take 1 tablet by mouth daily.    . TRADJENTA 5 MG TABS tablet Take 5 mg by mouth every morning.  5  . acetaminophen (TYLENOL) 500 MG tablet Take 1 tablet (500 mg total) by mouth every 6 (six) hours as needed for pain. (Patient not taking: Reported on 10/22/2016) 30 tablet 0  . APPLE CIDER VINEGAR PO Take 2 tablets by mouth 2 (two) times daily.    Marland Kitchen glucose blood test strip E11.9. Test blood glucose three times a day. Use as instructed 100 each  12  . rosuvastatin (CRESTOR) 10 MG tablet 1 TABLET ONCE A DAY BY MOUTH 90  3  . tetrahydrozoline 0.05 % ophthalmic solution Place 1 drop into both eyes as needed. For dry eyes.    Marland Kitchen triamcinolone cream (KENALOG) 0.1 % APPLY TO AFFECTED AREA TWICE A DAY  2   No current facility-administered medications for this visit.     Allergies: No Known Allergies  Past Surgical History:  Procedure Laterality Date  . ABDOMINAL HYSTERECTOMY      Social History   Social History  . Marital status: Married    Spouse name: N/A  . Number of children: N/A  . Years of education: N/A   Social History Main Topics  . Smoking status: Never Smoker  . Smokeless tobacco: Never Used  . Alcohol use No  . Drug use: No  . Sexual activity: Not Asked   Other Topics Concern  . None   Social History Narrative  . None    Review of Systems  Constitutional: Negative for chills, fever and weight loss.  HENT: Negative for congestion, hearing loss and sore throat.   Eyes: Negative for blurred vision, double vision and photophobia.  Respiratory: Negative for cough, shortness of breath and wheezing.   Cardiovascular: Negative for chest pain, palpitations and orthopnea.  Gastrointestinal: Negative for abdominal pain, nausea and vomiting.  Genitourinary: Negative for dysuria, frequency and urgency.  Musculoskeletal: Negative for back pain, myalgias and neck pain.  Skin: Positive for itching and rash.  Neurological: Negative for dizziness, tingling and headaches.  Psychiatric/Behavioral: Negative for depression. The patient is not nervous/anxious.     Objective: Vitals:   10/22/16 1151  BP: 138/75  Pulse: 73  Resp: 16  Temp: 98.9 F (37.2 C)  TempSrc: Oral  SpO2: 95%  Weight: 138 lb (62.6 kg)  Height: 5\' 4"  (1.626 m)   Diabetic Foot Exam - Simple   Simple Foot Form Diabetic Foot exam was performed with the following findings:  Yes 10/22/2016 12:36 PM  Visual Inspection No deformities, no  ulcerations, no other skin breakdown bilaterally:  Yes Sensation Testing Intact to touch and monofilament testing bilaterally:  Yes Pulse Check Posterior Tibialis and Dorsalis pulse intact bilaterally:  Yes Comments      Physical Exam  Constitutional: She is oriented to person, place, and time. She appears well-developed and well-nourished.  HENT:  Head: Normocephalic and atraumatic.  Eyes: Conjunctivae and EOM are normal.  Neck: Normal range of motion. No thyromegaly present.  Cardiovascular: Normal rate, regular rhythm, normal heart sounds and intact distal pulses.   No murmur heard. Pulmonary/Chest: Effort normal and breath sounds normal. No respiratory distress. She has no wheezes. She has no rales. She exhibits no tenderness.  Abdominal: Soft. Bowel sounds are normal. She exhibits no distension. There is no tenderness. There is no guarding.  Neurological: She is alert and oriented to person, place, and time.  Skin: Skin is warm. Capillary refill takes less than 2 seconds. No erythema.  Old scars from hives noted. No active lesions today  Psychiatric: She has a normal mood and affect. Her behavior is normal. Judgment and thought content normal.    Assessment and Plan Laetitia was seen today for establish care and diabetes.  Diagnoses and all orders for this visit:  Type 2 diabetes mellitus without complication, without long-term current use of insulin ([place])- a1c close to goal despite being off metformin -     POCT glycosylated hemoglobin (Hb A1C) -     Comprehensive metabolic panel -     Lipid panel -     Microalbumin, urine  Urticaria- will check some baseline labs, keep appt with Dermatology -     CBC with Differential/Platelet -     Sedimentation Rate -     Comprehensive metabolic panel  Other orders -     glucose blood test strip; E11.9. Test blood glucose three times a day. Use as instructed     [place]

## 2016-10-29 ENCOUNTER — Other Ambulatory Visit: Payer: Self-pay | Admitting: Internal Medicine

## 2016-10-29 ENCOUNTER — Other Ambulatory Visit: Payer: Self-pay | Admitting: Family Medicine

## 2016-10-29 DIAGNOSIS — Z1231 Encounter for screening mammogram for malignant neoplasm of breast: Secondary | ICD-10-CM

## 2016-10-30 DIAGNOSIS — E119 Type 2 diabetes mellitus without complications: Secondary | ICD-10-CM | POA: Diagnosis not present

## 2016-10-30 DIAGNOSIS — Z961 Presence of intraocular lens: Secondary | ICD-10-CM | POA: Diagnosis not present

## 2016-10-30 LAB — HM DIABETES EYE EXAM

## 2016-11-04 ENCOUNTER — Encounter: Payer: Self-pay | Admitting: Family Medicine

## 2016-11-04 DIAGNOSIS — Z961 Presence of intraocular lens: Secondary | ICD-10-CM | POA: Insufficient documentation

## 2016-11-07 ENCOUNTER — Encounter: Payer: Self-pay | Admitting: Family Medicine

## 2016-11-08 LAB — CBC WITH DIFFERENTIAL/PLATELET
BASOS ABS: 0 10*3/uL (ref 0.0–0.2)
Basos: 1 %
EOS (ABSOLUTE): 0.1 10*3/uL (ref 0.0–0.4)
Eos: 2 %
Hematocrit: 40.2 % (ref 34.0–46.6)
Hemoglobin: 13.6 g/dL (ref 11.1–15.9)
IMMATURE GRANS (ABS): 0 10*3/uL (ref 0.0–0.1)
IMMATURE GRANULOCYTES: 0 %
LYMPHS: 38 %
Lymphocytes Absolute: 1.8 10*3/uL (ref 0.7–3.1)
MCH: 30.6 pg (ref 26.6–33.0)
MCHC: 33.8 g/dL (ref 31.5–35.7)
MCV: 91 fL (ref 79–97)
Monocytes Absolute: 0.4 10*3/uL (ref 0.1–0.9)
Monocytes: 9 %
NEUTROS PCT: 50 %
Neutrophils Absolute: 2.4 10*3/uL (ref 1.4–7.0)
PLATELETS: 243 10*3/uL (ref 150–379)
RBC: 4.44 x10E6/uL (ref 3.77–5.28)
RDW: 14.3 % (ref 12.3–15.4)
WBC: 4.8 10*3/uL (ref 3.4–10.8)

## 2016-11-08 LAB — LIPID PANEL
CHOL/HDL RATIO: 4.2 ratio (ref 0.0–4.4)
CHOLESTEROL TOTAL: 227 mg/dL — AB (ref 100–199)
HDL: 54 mg/dL (ref >39)
LDL Calculated: 147 mg/dL — ABNORMAL HIGH (ref 0–99)
TRIGLYCERIDES: 129 mg/dL (ref 0–149)
VLDL Cholesterol Cal: 26 mg/dL (ref 5–40)

## 2016-11-08 LAB — COMPREHENSIVE METABOLIC PANEL
A/G RATIO: 1.4 (ref 1.2–2.2)
ALK PHOS: 79 IU/L (ref 39–117)
ALT: 23 IU/L (ref 0–32)
AST: 20 IU/L (ref 0–40)
Albumin: 4.4 g/dL (ref 3.5–4.8)
BUN/Creatinine Ratio: 18 (ref 12–28)
BUN: 14 mg/dL (ref 8–27)
Bilirubin Total: 0.2 mg/dL (ref 0.0–1.2)
CALCIUM: 9.5 mg/dL (ref 8.7–10.3)
CO2: 22 mmol/L (ref 18–29)
Chloride: 99 mmol/L (ref 96–106)
Creatinine, Ser: 0.8 mg/dL (ref 0.57–1.00)
GFR calc Af Amer: 81 mL/min/{1.73_m2} (ref >59)
GFR, EST NON AFRICAN AMERICAN: 70 mL/min/{1.73_m2} (ref >59)
Globulin, Total: 3.2 g/dL (ref 1.5–4.5)
Glucose: 256 mg/dL — ABNORMAL HIGH (ref 65–99)
POTASSIUM: 4.7 mmol/L (ref 3.5–5.2)
Sodium: 137 mmol/L (ref 134–144)
Total Protein: 7.6 g/dL (ref 6.0–8.5)

## 2016-11-08 LAB — MICROALBUMIN, URINE

## 2016-11-08 LAB — SEDIMENTATION RATE: SED RATE: 17 mm/h (ref 0–40)

## 2016-11-18 DIAGNOSIS — L508 Other urticaria: Secondary | ICD-10-CM | POA: Diagnosis not present

## 2016-11-18 DIAGNOSIS — Z7984 Long term (current) use of oral hypoglycemic drugs: Secondary | ICD-10-CM | POA: Diagnosis not present

## 2016-11-18 DIAGNOSIS — E119 Type 2 diabetes mellitus without complications: Secondary | ICD-10-CM | POA: Diagnosis not present

## 2016-11-21 ENCOUNTER — Ambulatory Visit: Payer: Medicare Other

## 2016-12-01 ENCOUNTER — Ambulatory Visit
Admission: RE | Admit: 2016-12-01 | Discharge: 2016-12-01 | Disposition: A | Discharge status: Home / Self Care | Payer: Medicare Other | Source: Ambulatory Visit | Attending: Family Medicine | Admitting: Family Medicine

## 2016-12-01 DIAGNOSIS — Z1231 Encounter for screening mammogram for malignant neoplasm of breast: Secondary | ICD-10-CM | POA: Diagnosis not present

## 2017-01-20 ENCOUNTER — Ambulatory Visit (INDEPENDENT_AMBULATORY_CARE_PROVIDER_SITE_OTHER): Payer: Medicare Other | Admitting: Family Medicine

## 2017-01-20 ENCOUNTER — Encounter: Payer: Self-pay | Admitting: Family Medicine

## 2017-01-20 VITALS — BP 144/72 | HR 89 | Temp 98.0°F | Resp 17 | Ht 65.5 in | Wt 135.0 lb

## 2017-01-20 DIAGNOSIS — E119 Type 2 diabetes mellitus without complications: Secondary | ICD-10-CM

## 2017-01-20 DIAGNOSIS — L508 Other urticaria: Secondary | ICD-10-CM | POA: Diagnosis not present

## 2017-01-20 DIAGNOSIS — R739 Hyperglycemia, unspecified: Secondary | ICD-10-CM | POA: Diagnosis not present

## 2017-01-20 LAB — POCT GLYCOSYLATED HEMOGLOBIN (HGB A1C): HEMOGLOBIN A1C: 10.8

## 2017-01-20 MED ORDER — GLIMEPIRIDE 2 MG PO TABS: 2.0000 mg | ORAL_TABLET | Freq: Two times a day (BID) | ORAL | 6 refills | Status: DC

## 2017-01-20 MED ORDER — METFORMIN HCL 500 MG PO TABS: 500.0000 mg | ORAL_TABLET | Freq: Every day | ORAL | 0 refills | Status: DC

## 2017-01-20 NOTE — Progress Notes (Signed)
Chief Complaint  Patient presents with  . Follow-up    hives    HPI   Pt reports that she was diagnosed with chronic hives She states that she was told that the hives could be caused by her medications She reports that she was alternating her medications and stopped her metformin and crestor because she was not sure if the metformin or cresitor is causing her itching Lab Results  Component Value Date   HGBA1C 10.8 01/20/2017   Her home glucose readings have been elevated 140-180s fasting but lately has been 240s She also is doing vinegar and cinnamon to see that that can help lower her blood glucose.     Past Medical History:  Diagnosis Date  . Diabetes mellitus without complication [facility])     Current Outpatient Prescriptions  Medication Sig Dispense Refill  . acetaminophen (TYLENOL) 500 MG tablet Take 1 tablet (500 mg total) by mouth every 6 (six) hours as needed for pain. 30 tablet 0  . APPLE CIDER VINEGAR PO Take 2 tablets by mouth 2 (two) times daily.    Marland Kitchen glimepiride (AMARYL) 2 MG tablet Take 1 tablet (2 mg total) by mouth 2 (two) times daily. 60 tablet 6  . glucose blood test strip E11.9. Test blood glucose three times a day. Use as instructed 100 each 12  . metFORMIN (GLUCOPHAGE) 500 MG tablet Take 1 tablet (500 mg total) by mouth daily. 90 tablet 0  . Red Yeast Rice Extract (RED YEAST RICE PO) Take 1 tablet by mouth daily.    . rosuvastatin (CRESTOR) 10 MG tablet Take 10 mg by mouth every other day.  3  . tetrahydrozoline 0.05 % ophthalmic solution Place 1 drop into both eyes as needed. For dry eyes.    . TRADJENTA 5 MG TABS tablet Take 5 mg by mouth every morning.  5  . triamcinolone cream (KENALOG) 0.1 % APPLY TO AFFECTED AREA TWICE A DAY  2   No current facility-administered medications for this visit.     Allergies: No Known Allergies  Past Surgical History:  Procedure Laterality Date  . ABDOMINAL HYSTERECTOMY      Social History   Social History  .  Marital status: Married    Spouse name: N/A  . Number of children: N/A  . Years of education: N/A   Social History Main Topics  . Smoking status: Never Smoker  . Smokeless tobacco: Never Used  . Alcohol use No  . Drug use: No  . Sexual activity: No   Other Topics Concern  . None   Social History Narrative  . None    Review of Systems  Constitutional: Negative for chills and fever.  Respiratory: Negative for cough, shortness of breath and wheezing.   Cardiovascular: Negative for chest pain, palpitations and orthopnea.  Gastrointestinal: Negative for abdominal pain, nausea and vomiting.  Genitourinary: Negative for dysuria, frequency and urgency.  Skin: Positive for itching. Negative for rash.  Neurological: Negative for dizziness and headaches.    Objective: Vitals:   01/20/17 1017  BP: (!) 144/72  Pulse: 89  Resp: 17  Temp: 98 F (36.7 C)  TempSrc: Oral  SpO2: 98%  Weight: 135 lb (61.2 kg)  Height: 5' 5.5" (1.664 m)    Physical Exam  Constitutional: She is oriented to person, place, and time. She appears well-developed and well-nourished.  HENT:  Head: Normocephalic and atraumatic.  Eyes: Conjunctivae and EOM are normal.  Cardiovascular: Normal rate, regular rhythm and normal heart sounds.  Pulmonary/Chest: Effort normal and breath sounds normal. No respiratory distress. She has no wheezes.  Neurological: She is alert and oriented to person, place, and time.  Psychiatric: She has a normal mood and affect. Her behavior is normal. Judgment and thought content normal.    Assessment and Plan Megan Wolfe was seen today for follow-up.  Diagnoses and all orders for this visit:  Type 2 diabetes mellitus without complication, without long-term current use of insulin (HCC) -     POCT glycosylated hemoglobin (Hb A1C) Despite hives would recommend continuing metformin but at a lower dose of 500mg  daily Increased amaryl to bid Continue diabetic diet   Chronic  urticaria- continue allergy medications as suggested   Hyperglycemia- a1c out of range compared to previous will recheck fructosamine or a1c next visit -     POCT glycosylated hemoglobin (Hb A1C)  Other orders -     glimepiride (AMARYL) 2 MG tablet; Take 1 tablet (2 mg total) by mouth 2 (two) times daily. -     metFORMIN (GLUCOPHAGE) 500 MG tablet; Take 1 tablet (500 mg total) by mouth daily.   A total of 30 minutes were spent face-to-face with the patient during this encounter and over half of that time was spent on counseling and coordination of care.   [place]

## 2017-01-20 NOTE — Patient Instructions (Addendum)
   IF you received an x-ray today, you will receive an invoice from [facility]. Please contact [facility] at [phone] with questions or concerns regarding your invoice.   IF you received labwork today, you will receive an invoice from [facility]. Please contact [facility] at [phone] with questions or concerns regarding your invoice.   Our billing staff will not be able to assist you with questions regarding bills from these companies.  You will be contacted with the lab results as soon as they are available. The fastest way to get your results is to activate your My Chart account. Instructions are located on the last page of this paperwork. If you have not heard from us regarding the results in 2 weeks, please contact this office.     Blood Glucose Monitoring, Adult Monitoring your blood sugar (glucose) helps you manage your diabetes. It also helps you and your health care provider determine how well your diabetes management plan is working. Blood glucose monitoring involves checking your blood glucose as often as directed, and keeping a record (log) of your results over time. Why should I monitor my blood glucose? Checking your blood glucose regularly can:  Help you understand how food, exercise, illnesses, and medicines affect your blood glucose.  Let you know what your blood glucose is at any time. You can quickly tell if you are having low blood glucose (hypoglycemia) or high blood glucose (hyperglycemia).  Help you and your health care provider adjust your medicines as needed.  When should I check my blood glucose? Follow instructions from your health care provider about how often to check your blood glucose. This may depend on:  The type of diabetes you have.  How well-controlled your diabetes is.  Medicines you are taking.  If you have type 1 diabetes:  Check your blood glucose at least 2 times a day.  Also check your blood glucose: ? Before  every insulin injection. ? Before and after exercise. ? Between meals. ? 2 hours after a meal. ? Occasionally between 2:00 a.m. and 3:00 a.m., as directed. ? Before potentially dangerous tasks, like driving or using heavy machinery. ? At bedtime.  You may need to check your blood glucose more often, up to 6-10 times a day: ? If you use an insulin pump. ? If you need multiple daily injections (MDI). ? If your diabetes is not well-controlled. ? If you are ill. ? If you have a history of severe hypoglycemia. ? If you have a history of not knowing when your blood glucose is getting low (hypoglycemia unawareness). If you have type 2 diabetes:  If you take insulin or other diabetes medicines, check your blood glucose at least 2 times a day.  If you are on intensive insulin therapy, check your blood glucose at least 4 times a day. Occasionally, you may also need to check between 2:00 a.m. and 3:00 a.m., as directed.  Also check your blood glucose: ? Before and after exercise. ? Before potentially dangerous tasks, like driving or using heavy machinery.  You may need to check your blood glucose more often if: ? Your medicine is being adjusted. ? Your diabetes is not well-controlled. ? You are ill. What is a blood glucose log?  A blood glucose log is a record of your blood glucose readings. It helps you and your health care provider: ? Look for patterns in your blood glucose over time. ? Adjust your diabetes management plan as needed.  Every time you check   your blood glucose, write down your result and notes about things that may be affecting your blood glucose, such as your diet and exercise for the day.  Most glucose meters store a record of glucose readings in the meter. Some meters allow you to download your records to a computer. How do I check my blood glucose? Follow these steps to get accurate readings of your blood glucose: Supplies needed   Blood glucose meter.  Test  strips for your meter. Each meter has its own strips. You must use the strips that come with your meter.  A needle to prick your finger (lancet). Do not use lancets more than once.  A device that holds the lancet (lancing device).  A journal or log book to write down your results. Procedure  Wash your hands with soap and water.  Prick the side of your finger (not the tip) with the lancet. Use a different finger each time.  Gently rub the finger until a small drop of blood appears.  Follow instructions that come with your meter for inserting the test strip, applying blood to the strip, and using your blood glucose meter.  Write down your result and any notes. Alternative testing sites  Some meters allow you to use areas of your body other than your finger (alternative sites) to test your blood.  If you think you may have hypoglycemia, or if you have hypoglycemia unawareness, do not use alternative sites. Use your finger instead.  Alternative sites may not be as accurate as the fingers, because blood flow is slower in these areas. This means that the result you get may be delayed, and it may be different from the result that you would get from your finger.  The most common alternative sites are: ? Forearm. ? Thigh. ? Palm of the hand. Additional tips  Always keep your supplies with you.  If you have questions or need help, all blood glucose meters have a 24-hour "hotline" number that you can call. You may also contact your health care provider.  After you use a few boxes of test strips, adjust (calibrate) your blood glucose meter by following instructions that came with your meter. This information is not intended to replace advice given to you by your health care provider. Make sure you discuss any questions you have with your health care provider. Document Released: 06/12/2003 Document Revised: 12/28/2015 Document Reviewed: 11/19/2015 Elsevier Interactive Patient Education   2017 Elsevier Inc.  

## 2017-01-21 ENCOUNTER — Ambulatory Visit: Payer: Medicare Other | Admitting: Family Medicine

## 2017-01-23 ENCOUNTER — Emergency Department (HOSPITAL_COMMUNITY)
Admission: EM | Admit: 2017-01-23 | Discharge: 2017-01-23 | Disposition: A | Discharge status: Home / Self Care | Payer: Medicare Other | Attending: Emergency Medicine | Admitting: Emergency Medicine

## 2017-01-23 ENCOUNTER — Telehealth: Payer: Self-pay | Admitting: Physician Assistant

## 2017-01-23 ENCOUNTER — Encounter (HOSPITAL_COMMUNITY): Payer: Self-pay

## 2017-01-23 ENCOUNTER — Emergency Department (HOSPITAL_COMMUNITY): Payer: Medicare Other

## 2017-01-23 DIAGNOSIS — E119 Type 2 diabetes mellitus without complications: Secondary | ICD-10-CM | POA: Diagnosis not present

## 2017-01-23 DIAGNOSIS — R079 Chest pain, unspecified: Secondary | ICD-10-CM | POA: Diagnosis not present

## 2017-01-23 DIAGNOSIS — R0789 Other chest pain: Secondary | ICD-10-CM | POA: Diagnosis not present

## 2017-01-23 DIAGNOSIS — Z7984 Long term (current) use of oral hypoglycemic drugs: Secondary | ICD-10-CM | POA: Insufficient documentation

## 2017-01-23 DIAGNOSIS — R1084 Generalized abdominal pain: Secondary | ICD-10-CM | POA: Diagnosis not present

## 2017-01-23 HISTORY — DX: Pure hypercholesterolemia, unspecified: E78.00

## 2017-01-23 LAB — BASIC METABOLIC PANEL
Anion gap: 9 (ref 5–15)
BUN: 10 mg/dL (ref 6–20)
CHLORIDE: 109 mmol/L (ref 101–111)
CO2: 22 mmol/L (ref 22–32)
CREATININE: 0.82 mg/dL (ref 0.44–1.00)
Calcium: 9.5 mg/dL (ref 8.9–10.3)
GFR calc non Af Amer: >60 mL/min (ref >60)
Glucose, Bld: 145 mg/dL — ABNORMAL HIGH (ref 65–99)
POTASSIUM: 4.5 mmol/L (ref 3.5–5.1)
SODIUM: 140 mmol/L (ref 135–145)

## 2017-01-23 LAB — I-STAT TROPONIN, ED
TROPONIN I, POC: 0 ng/mL (ref 0.00–0.08)
Troponin i, poc: 0 ng/mL (ref 0.00–0.08)

## 2017-01-23 LAB — CBC
HEMATOCRIT: 37.6 % (ref 36.0–46.0)
Hemoglobin: 13.1 g/dL (ref 12.0–15.0)
MCH: 30.3 pg (ref 26.0–34.0)
MCHC: 34.8 g/dL (ref 30.0–36.0)
MCV: 86.8 fL (ref 78.0–100.0)
Platelets: 245 10*3/uL (ref 150–400)
RBC: 4.33 MIL/uL (ref 3.87–5.11)
RDW: 13.3 % (ref 11.5–15.5)
WBC: 5.1 10*3/uL (ref 4.0–10.5)

## 2017-01-23 LAB — CBG MONITORING, ED: GLUCOSE-CAPILLARY: 118 mg/dL — AB (ref 65–99)

## 2017-01-23 NOTE — Discharge Instructions (Signed)
We saw you in the ER for the chest pain. All of our cardiac workup is normal, including labs, EKG and chest X-RAY are normal. We are not sure what is causing your discomfort, but we feel comfortable sending you home at this time. The workup in the ER is not complete, and you should follow up with your primary care doctor for further evaluation.  Please return to the ER if you have worsening chest pain, shortness of breath, pain radiating to your jaw, shoulder, or back, sweats or fainting. Otherwise see the Cardiologist or your primary care doctor as requested. 

## 2017-01-23 NOTE — ED Provider Notes (Signed)
[place] DEPT Provider Note   CSN: [id number] Arrival date & time: 01/23/17  1112     History   Chief Complaint Chief Complaint  Patient presents with  . Chest Pain    HPI Megan Wolfe is a 81 y.o. female.  HPI Pt comes in with cc of chest pain. Pt has hx of HL, DM. Pt woke up this AM with indigestion and L sided sharp chest pain x several days. Pt reports that pain is intermittent, but progressing. Pain is non radiating. Pain is better when she makes herself belch, but pain is not associated to po intake. Pain is not associated with po intake. Pt has no hx of GERD.  Past Medical History:  Diagnosis Date  . Diabetes mellitus without complication ([place])   . High cholesterol     Patient Active Problem List   Diagnosis Date Noted  . Pseudophakia 11/04/2016  . Type 2 diabetes mellitus without complication, without long-term current use of insulin ([place]) 10/22/2016  . Urticaria 10/22/2016    Past Surgical History:  Procedure Laterality Date  . ABDOMINAL HYSTERECTOMY      OB History    No data available       Home Medications    Prior to Admission medications   Medication Sig Start Date End Date Taking? Authorizing Provider  aspirin EC 81 MG tablet Take 324 mg by mouth once.   Yes [provider]  glimepiride (AMARYL) 2 MG tablet Take 1 tablet (2 mg total) by mouth 2 (two) times daily. 01/20/17  Yes Delia Chimes A, MD  metFORMIN (GLUCOPHAGE) 500 MG tablet Take 1 tablet (500 mg total) by mouth daily. 01/20/17  Yes Stallings, Zoe A, MD  rosuvastatin (CRESTOR) 10 MG tablet Take 10 mg by mouth every other day. 07/28/16  Yes [provider]  tetrahydrozoline 0.05 % ophthalmic solution Place 1 drop into both eyes as needed. For dry eyes.   Yes [provider]  TRADJENTA 5 MG TABS tablet Take 5 mg by mouth every morning. 10/13/16  Yes [provider]  glucose blood test strip E11.9. Test blood glucose three times a day. Use as  instructed 10/22/16   Forrest Moron, MD    Family History Family History  Problem Relation Age of Onset  . Diabetes Mother   . Diabetes Sister   . Breast cancer Neg Hx     Social History Social History  Substance Use Topics  . Smoking status: Never Smoker  . Smokeless tobacco: Never Used  . Alcohol use No     Allergies   Patient has no known allergies.   Review of Systems Review of Systems  Constitutional: Negative for activity change.  Respiratory: Negative for shortness of breath.   Cardiovascular: Positive for chest pain.  Gastrointestinal: Positive for abdominal pain.  Neurological: Negative for dizziness.     Physical Exam Updated Vital Signs BP (!) 137/94 (BP Location: Right Arm)   Pulse 60   Temp 98.7 F (37.1 C) (Oral)   Resp 16   Ht 5\' 4"  (1.626 m)   Wt 61.2 kg (135 lb)   SpO2 99%   BMI 23.17 kg/m   Physical Exam  Constitutional: She is oriented to person, place, and time. She appears well-developed.  HENT:  Head: Normocephalic and atraumatic.  Eyes: EOM are normal.  Neck: Normal range of motion. Neck supple.  Cardiovascular: Normal rate.   Pulmonary/Chest: Effort normal.  Abdominal: Bowel sounds are normal.  Neurological: She is alert  and oriented to person, place, and time.  Skin: Skin is warm and dry.  Nursing note and vitals reviewed.    ED Treatments / Results  Labs (all labs ordered are listed, but only abnormal results are displayed) Labs Reviewed  BASIC METABOLIC PANEL - Abnormal; Notable for the following:       Result Value   Glucose, Bld 145 (*)    All other components within normal limits  CBG MONITORING, ED - Abnormal; Notable for the following:    Glucose-Capillary 118 (*)    All other components within normal limits  CBC  I-STAT TROPONIN, ED  I-STAT TROPONIN, ED    EKG  EKG Interpretation  Date/Time:  Friday January 23 2017 11:24:22 EDT Ventricular Rate:  67 PR Interval:    QRS Duration: 91 QT  Interval:  391 QTC Calculation: 413 R Axis:   25 Text Interpretation:  Sinus rhythm Abnormal R-wave progression, early transition Borderline T wave abnormalities No acute changes Confirmed by Varney Biles [id number] on 01/23/2017 12:39:10 PM       Radiology Dg Chest 2 View  Result Date: 01/23/2017 CLINICAL DATA:  Recent development of mid chest pain and shortness of breath. EXAM: CHEST  2 VIEW COMPARISON:  None. FINDINGS: Heart size is normal. Mild aortic tortuosity. The lungs are clear. The vascularity is normal. No effusions. Mild spinal curvature. IMPRESSION: No active cardiopulmonary disease. Electronically Signed   By: Nelson Chimes M.D.   On: 01/23/2017 11:56    Procedures Procedures (including critical care time)  Medications Ordered in ED Medications - No data to display   Initial Impression / Assessment and Plan / ED Course  I have reviewed the triage vital signs and the nursing notes.  Pertinent labs & imaging results that were available during my care of the patient were reviewed by me and considered in my medical decision making (see chart for details).  Clinical Course as of Jan 24 1547  Fri Jan 23, 2017  1157 EKG 12-Lead [DP]  1158 EKG 12-Lead [DP]    Clinical Course User Index [DP] Plocki, Dylan T, Student-PA    Pt comes in with cc of chest pain. Chest pain is not typical for ACS - it is L sided, but feels like GERD. However, I am convinced she is not having GERD as the last 3 episodes were not related to po intake and she has no hx of GERD in general.  We will get delta trop. If neg, d/c with Cards f/u, Advised PCP f/U if cards clears.  Final Clinical Impressions(s) / ED Diagnoses   Final diagnoses:  Atypical chest pain  Nonspecific chest pain    New Prescriptions New Prescriptions   No medications on file     Varney Biles, MD 01/23/17 1555

## 2017-01-23 NOTE — ED Triage Notes (Addendum)
Patient states she has been having indigestion and sharp pain in the left chest x several days ago. Patient reports that she woke up and had indigestion and sharp pain again. Patient states when she got up the pain went away. Patient also reports night sweats and elevated blood sugars in the 200 and 300's.  Patient denies N/v/D or SOB.

## 2017-01-23 NOTE — Telephone Encounter (Signed)
Covering cardmaster pager - Received call from EDP Dr. Kathrynn Humble requesting OP f/u for eval of atypical chest pain. I have sent a message to our office's scheduler requesting a follow-up appointment, and our office will call the patient with this information.  Dayna Dunn PA-C

## 2017-02-20 ENCOUNTER — Encounter: Payer: Self-pay | Admitting: Family Medicine

## 2017-02-20 ENCOUNTER — Ambulatory Visit (INDEPENDENT_AMBULATORY_CARE_PROVIDER_SITE_OTHER): Payer: Medicare Other | Admitting: Family Medicine

## 2017-02-20 VITALS — BP 134/73 | HR 74 | Temp 98.0°F | Resp 18 | Ht 65.5 in | Wt 133.4 lb

## 2017-02-20 DIAGNOSIS — E162 Hypoglycemia, unspecified: Secondary | ICD-10-CM | POA: Diagnosis not present

## 2017-02-20 DIAGNOSIS — Z23 Encounter for immunization: Secondary | ICD-10-CM | POA: Diagnosis not present

## 2017-02-20 DIAGNOSIS — E119 Type 2 diabetes mellitus without complications: Secondary | ICD-10-CM | POA: Diagnosis not present

## 2017-02-20 DIAGNOSIS — K219 Gastro-esophageal reflux disease without esophagitis: Secondary | ICD-10-CM | POA: Diagnosis not present

## 2017-02-20 LAB — POCT GLYCOSYLATED HEMOGLOBIN (HGB A1C): Hemoglobin A1C: 8.2

## 2017-02-20 MED ORDER — METFORMIN HCL 500 MG PO TABS: 250.0000 mg | ORAL_TABLET | Freq: Two times a day (BID) | ORAL | 0 refills | Status: DC

## 2017-02-20 NOTE — Progress Notes (Signed)
Chief Complaint  Patient presents with  . Diabetes  . Follow-up    HPI   Diabetes Mellitus and hypoglycemia: Patient presents for follow up of diabetes.  She stopped taking Tradjenta She reports that she is still taking metformin and glimepiride She states that she she gets some hypoglycemia in the afternoons her sugars can get at low as 50 She gets jittery and feels weak She denies numbness or tingling in her hands and feet She states that she eats breakfast, lunch but it is not consistent. She typically eats Megan good breakfast and typically eats dinner at 4pm At night she typically has an evening snack She reports that her range is 119-130 She reports that she is having loose stools and diarrhea and she is very bothered by this.   Lab Results  Component Value Date   HGBA1C 8.2 02/20/2017   Lab Results  Component Value Date   HGBA1C 8.2 02/20/2017    GERD She went to the ER on 01/23/17 and was diagnosed with GERD as the cause of her chest pains. She states that she has been doing much better since that time. She denies nausea, vomiting or chest pain She has been having diarrhea related to the metformin dose  Past Medical History:  Diagnosis Date  . Diabetes mellitus without complication ([place])   . High cholesterol     Current Outpatient Prescriptions  Medication Sig Dispense Refill  . aspirin EC 81 MG tablet Take 324 mg by mouth once.    Marland Kitchen glucose blood test strip E11.9. Test blood glucose three times Megan day. Use as instructed 100 each 12  . metFORMIN (GLUCOPHAGE) 500 MG tablet Take 0.5 tablets (250 mg total) by mouth 2 (two) times daily with Megan meal. 90 tablet 0  . rosuvastatin (CRESTOR) 10 MG tablet Take 10 mg by mouth every other day.  3  . tetrahydrozoline 0.05 % ophthalmic solution Place 1 drop into both eyes as needed. For dry eyes.     No current facility-administered medications for this visit.     Allergies: No Known Allergies  Past Surgical History:    Procedure Laterality Date  . ABDOMINAL HYSTERECTOMY      Social History   Social History  . Marital status: Married    Spouse name: N/Megan  . Number of children: N/Megan  . Years of education: N/Megan   Social History Main Topics  . Smoking status: Never Smoker  . Smokeless tobacco: Never Used  . Alcohol use No  . Drug use: No  . Sexual activity: No   Other Topics Concern  . None   Social History Narrative  . None    ROS See hpi  Objective: Vitals:   02/20/17 0913  BP: 134/73  Pulse: 74  Resp: 18  Temp: 98 F (36.7 C)  TempSrc: Oral  SpO2: 99%  Weight: 133 lb 6.4 oz (60.5 kg)  Height: 5' 5.5" (1.664 m)    Physical Exam Physical Exam  Constitutional: She is oriented to person, place, and time. She appears well-developed and well-nourished.  HENT:  Head: Normocephalic and atraumatic.  Eyes: Conjunctivae and EOM are normal.  Cardiovascular: Normal rate, regular rhythm and normal heart sounds.   Pulmonary/Chest: Effort normal and breath sounds normal. No respiratory distress. She has no wheezes.  Abdominal: Normal appearance and bowel sounds are normal. There is no tenderness. There is no CVA tenderness.  Neurological: She is alert and oriented to person, place, and time.    Assessment and  Plan Megan Wolfe was seen today for diabetes and follow-up.  Diagnoses and all orders for this visit:  Type 2 diabetes mellitus without complication, without long-term current use of insulin ([facility])- improved substantially titrating down meds  -     POCT glycosylated hemoglobin (Hb A1C)  Need for prophylactic vaccination and inoculation against influenza -     Flu Vaccine QUAD 36+ mos IM  Gastroesophageal reflux disease without esophagitis- resolved  Hypoglycemia- will goal for a1c of 8% will not try to control blood glucose too tightly Discontinued amaryl Advised metformin 250mg  (1/2 tab) bid with meals Discussed that she should still have her snacks Follow up in 3 months  If  sugars rebound and get high then will have pt call clinic to discuss  Other orders -     Cancel: DG Bone Density; Future -     Cancel: Flu Vaccine QUAD 36+ mos IM -     Cancel: Hemoglobin A1c -     metFORMIN (GLUCOPHAGE) 500 MG tablet; Take 0.5 tablets (250 mg total) by mouth 2 (two) times daily with Megan meal.     Megan Wolfe Megan Wolfe

## 2017-02-20 NOTE — Patient Instructions (Addendum)
Your a1c is much better! Stop the glimepiride and take only a half tablet of metformin with breakfast and dinner.      IF you received an x-ray today, you will receive an invoice from [facility]. Please contact [facility] at [phone] with questions or concerns regarding your invoice.   IF you received labwork today, you will receive an invoice from [facility]. Please contact [facility] at [phone] with questions or concerns regarding your invoice.   Our billing staff will not be able to assist you with questions regarding bills from these companies.  You will be contacted with the lab results as soon as they are available. The fastest way to get your results is to activate your My Chart account. Instructions are located on the last page of this paperwork. If you have not heard from [place] regarding the results in 2 weeks, please contact this office.      Hypoglycemia Hypoglycemia occurs when the level of sugar (glucose) in the blood is too low. Glucose is a type of sugar that provides the body's main source of energy. Certain hormones (insulin and glucagon) control the level of glucose in the blood. Insulin lowers blood glucose, and glucagon increases blood glucose. Hypoglycemia can result from having too much insulin in the bloodstream, or from not eating enough food that contains glucose. Hypoglycemia can happen in people who do or do not have diabetes. It can develop quickly, and it can be a medical emergency. What are the causes? Hypoglycemia occurs most often in people who have diabetes. If you have diabetes, hypoglycemia may be caused by:  Diabetes medicine.  Not eating enough, or not eating often enough.  Increased physical activity.  Drinking alcohol, especially when you have not eaten recently.  If you do not have diabetes, hypoglycemia may be caused by:  A tumor in the pancreas. The pancreas is the organ that makes insulin.  Not eating enough, or not  eating for long periods at a time (fasting).  Severe infection or illness that affects the liver, heart, or kidneys.  Certain medicines.  You may also have reactive hypoglycemia. This condition causes hypoglycemia within 4 hours of eating a meal. This may occur after having stomach surgery. Sometimes, the cause of reactive hypoglycemia is not known. What increases the risk? Hypoglycemia is more likely to develop in:  People who have diabetes and take medicines to lower blood glucose.  People who abuse alcohol.  People who have a severe illness.  What are the signs or symptoms? Hypoglycemia may not cause any symptoms. If you have symptoms, they may include:  Hunger.  Anxiety.  Sweating and feeling clammy.  Confusion.  Dizziness or feeling light-headed.  Sleepiness.  Nausea.  Increased heart rate.  Headache.  Blurry vision.  Seizure.  Nightmares.  Tingling or numbness around the mouth, lips, or tongue.  A change in speech.  Decreased ability to concentrate.  A change in coordination.  Restless sleep.  Tremors or shakes.  Fainting.  Irritability.  How is this diagnosed? Hypoglycemia is diagnosed with a blood test to measure your blood glucose level. This blood test is done while you are having symptoms. Your health care provider may also do a physical exam and review your medical history. If you do not have diabetes, other tests may be done to find the cause of your hypoglycemia. How is this treated? This condition can often be treated by immediately eating or drinking something that contains glucose, such as:  3-4 sugar tablets (  glucose pills).  Glucose gel, 15-gram tube.  Fruit juice, 4 oz (120 mL).  Regular soda (not diet soda), 4 oz (120 mL).  Low-fat milk, 4 oz (120 mL).  Several pieces of hard candy.  Sugar or honey, 1 Tbsp.  Treating Hypoglycemia If You Have Diabetes  If you are alert and able to swallow safely, follow the 15:15  rule:  Take 15 grams of a rapid-acting carbohydrate. Rapid-acting options include: ? 1 tube of glucose gel. ? 3 glucose pills. ? 6-8 pieces of hard candy. ? 4 oz (120 mL) of fruit juice. ? 4 oz (120 ml) of regular (not diet) soda.  Check your blood glucose 15 minutes after you take the carbohydrate.  If the repeat blood glucose level is still at or below 70 mg/dL (3.9 mmol/L), take 15 grams of a carbohydrate again.  If your blood glucose level does not increase above 70 mg/dL (3.9 mmol/L) after 3 tries, seek emergency medical care.  After your blood glucose level returns to normal, eat a meal or a snack within 1 hour.  Treating Severe Hypoglycemia Severe hypoglycemia is when your blood glucose level is at or below 54 mg/dL (3 mmol/L). Severe hypoglycemia is an emergency. Do not wait to see if the symptoms will go away. Get medical help right away. Call your local emergency services (911 in the [place] Do not drive yourself to the hospital. If you have severe hypoglycemia and you cannot eat or drink, you may need an injection of glucagon. A family member or close friend should learn how to check your blood glucose and how to give you a glucagon injection. Ask your health care provider if you need to have an emergency glucagon injection kit available. Severe hypoglycemia may need to be treated in a hospital. The treatment may include getting glucose through an IV tube. You may also need treatment for the cause of your hypoglycemia. Follow these instructions at home: General instructions  Avoid any diets that cause you to not eat enough food. Talk with your health care provider before you start any new diet.  Take over-the-counter and prescription medicines only as told by your health care provider.  Limit alcohol intake to no more than 1 drink per day for nonpregnant women and 2 drinks per day for men. One drink equals 12 oz of beer, 5 oz of wine, or 1 oz of hard liquor.  Keep all  follow-up visits as told by your health care provider. This is important. If You Have Diabetes:   Make sure you know the symptoms of hypoglycemia.  Always have a rapid-acting carbohydrate snack with you to treat low blood sugar.  Follow your diabetes management plan, as told by your health care provider. Make sure you: ? Take your medicines as directed. ? Follow your exercise plan. ? Follow your meal plan. Eat on time, and do not skip meals. ? Check your blood glucose as often as directed. Make sure to check your blood glucose before and after exercise. If you exercise longer or in a different way than usual, check your blood glucose more often. ? Follow your sick day plan whenever you cannot eat or drink normally. Make this plan in advance with your health care provider.  Share your diabetes management plan with people in your workplace, school, and household.  Check your urine for ketones when you are ill and as told by your health care provider.  Carry a medical alert card or wear medical alert jewelry. If  You Have Reactive Hypoglycemia or Low Blood Sugar From Other Causes:  Monitor your blood glucose as told by your health care provider.  Follow instructions from your health care provider about eating or drinking restrictions. Contact a health care provider if:  You have problems keeping your blood glucose in your target range.  You have frequent episodes of hypoglycemia. Get help right away if:  You continue to have hypoglycemia symptoms after eating or drinking something containing glucose.  Your blood glucose is at or below 54 mg/dL (3 mmol/L).  You have a seizure.  You faint. These symptoms may represent a serious problem that is an emergency. Do not wait to see if the symptoms will go away. Get medical help right away. Call your local emergency services (911 in the [place] Do not drive yourself to the hospital. This information is not intended to replace advice given  to you by your health care provider. Make sure you discuss any questions you have with your health care provider. Document Released: 06/09/2005 Document Revised: 11/21/2015 Document Reviewed: 07/13/2015 Elsevier Interactive Patient Education  [employer].

## 2017-05-25 ENCOUNTER — Ambulatory Visit: Payer: Medicare Other | Admitting: Family Medicine

## 2017-06-01 ENCOUNTER — Ambulatory Visit: Payer: Medicare Other | Admitting: Family Medicine

## 2017-06-11 ENCOUNTER — Encounter: Payer: Self-pay | Admitting: Family Medicine

## 2017-06-11 ENCOUNTER — Ambulatory Visit (INDEPENDENT_AMBULATORY_CARE_PROVIDER_SITE_OTHER): Payer: Medicare Other | Admitting: Family Medicine

## 2017-06-11 ENCOUNTER — Other Ambulatory Visit: Payer: Self-pay

## 2017-06-11 VITALS — BP 130/84 | HR 82 | Temp 98.2°F | Resp 16 | Ht 65.5 in | Wt 132.6 lb

## 2017-06-11 DIAGNOSIS — E118 Type 2 diabetes mellitus with unspecified complications: Secondary | ICD-10-CM | POA: Diagnosis not present

## 2017-06-11 DIAGNOSIS — Z5181 Encounter for therapeutic drug level monitoring: Secondary | ICD-10-CM | POA: Diagnosis not present

## 2017-06-11 DIAGNOSIS — E785 Hyperlipidemia, unspecified: Secondary | ICD-10-CM

## 2017-06-11 LAB — POCT GLYCOSYLATED HEMOGLOBIN (HGB A1C): Hemoglobin A1C: 8.7

## 2017-06-11 MED ORDER — ROSUVASTATIN CALCIUM 10 MG PO TABS: 10.0000 mg | ORAL_TABLET | Freq: Every day | ORAL | 3 refills | Status: DC

## 2017-06-11 MED ORDER — METFORMIN HCL 1000 MG PO TABS: 1000.0000 mg | ORAL_TABLET | Freq: Two times a day (BID) | ORAL | 0 refills | Status: DC

## 2017-06-11 MED ORDER — ROSUVASTATIN CALCIUM 10 MG PO TABS: 10.0000 mg | ORAL_TABLET | ORAL | 3 refills | Status: DC

## 2017-06-11 NOTE — Patient Instructions (Addendum)
   IF you received an x-ray today, you will receive an invoice from [facility]. Please contact [facility] at [phone] with questions or concerns regarding your invoice.   IF you received labwork today, you will receive an invoice from [facility]. Please contact [facility] at [phone] with questions or concerns regarding your invoice.   Our billing staff will not be able to assist you with questions regarding bills from these companies.  You will be contacted with the lab results as soon as they are available. The fastest way to get your results is to activate your My Chart account. Instructions are located on the last page of this paperwork. If you have not heard from us regarding the results in 2 weeks, please contact this office.     Blood Glucose Monitoring, Adult Monitoring your blood sugar (glucose) helps you manage your diabetes. It also helps you and your health care provider determine how well your diabetes management plan is working. Blood glucose monitoring involves checking your blood glucose as often as directed, and keeping a record (log) of your results over time. Why should I monitor my blood glucose? Checking your blood glucose regularly can:  Help you understand how food, exercise, illnesses, and medicines affect your blood glucose.  Let you know what your blood glucose is at any time. You can quickly tell if you are having low blood glucose (hypoglycemia) or high blood glucose (hyperglycemia).  Help you and your health care provider adjust your medicines as needed.  When should I check my blood glucose? Follow instructions from your health care provider about how often to check your blood glucose. This may depend on:  The type of diabetes you have.  How well-controlled your diabetes is.  Medicines you are taking.  If you have type 1 diabetes:  Check your blood glucose at least 2 times a day.  Also check your blood glucose: ? Before  every insulin injection. ? Before and after exercise. ? Between meals. ? 2 hours after a meal. ? Occasionally between 2:00 a.m. and 3:00 a.m., as directed. ? Before potentially dangerous tasks, like driving or using heavy machinery. ? At bedtime.  You may need to check your blood glucose more often, up to 6-10 times a day: ? If you use an insulin pump. ? If you need multiple daily injections (MDI). ? If your diabetes is not well-controlled. ? If you are ill. ? If you have a history of severe hypoglycemia. ? If you have a history of not knowing when your blood glucose is getting low (hypoglycemia unawareness). If you have type 2 diabetes:  If you take insulin or other diabetes medicines, check your blood glucose at least 2 times a day.  If you are on intensive insulin therapy, check your blood glucose at least 4 times a day. Occasionally, you may also need to check between 2:00 a.m. and 3:00 a.m., as directed.  Also check your blood glucose: ? Before and after exercise. ? Before potentially dangerous tasks, like driving or using heavy machinery.  You may need to check your blood glucose more often if: ? Your medicine is being adjusted. ? Your diabetes is not well-controlled. ? You are ill. What is a blood glucose log?  A blood glucose log is a record of your blood glucose readings. It helps you and your health care provider: ? Look for patterns in your blood glucose over time. ? Adjust your diabetes management plan as needed.  Every time you check   your blood glucose, write down your result and notes about things that may be affecting your blood glucose, such as your diet and exercise for the day.  Most glucose meters store a record of glucose readings in the meter. Some meters allow you to download your records to a computer. How do I check my blood glucose? Follow these steps to get accurate readings of your blood glucose: Supplies needed   Blood glucose meter.  Test  strips for your meter. Each meter has its own strips. You must use the strips that come with your meter.  A needle to prick your finger (lancet). Do not use lancets more than once.  A device that holds the lancet (lancing device).  A journal or log book to write down your results. Procedure  Wash your hands with soap and water.  Prick the side of your finger (not the tip) with the lancet. Use a different finger each time.  Gently rub the finger until a small drop of blood appears.  Follow instructions that come with your meter for inserting the test strip, applying blood to the strip, and using your blood glucose meter.  Write down your result and any notes. Alternative testing sites  Some meters allow you to use areas of your body other than your finger (alternative sites) to test your blood.  If you think you may have hypoglycemia, or if you have hypoglycemia unawareness, do not use alternative sites. Use your finger instead.  Alternative sites may not be as accurate as the fingers, because blood flow is slower in these areas. This means that the result you get may be delayed, and it may be different from the result that you would get from your finger.  The most common alternative sites are: ? Forearm. ? Thigh. ? Palm of the hand. Additional tips  Always keep your supplies with you.  If you have questions or need help, all blood glucose meters have a 24-hour "hotline" number that you can call. You may also contact your health care provider.  After you use a few boxes of test strips, adjust (calibrate) your blood glucose meter by following instructions that came with your meter. This information is not intended to replace advice given to you by your health care provider. Make sure you discuss any questions you have with your health care provider. Document Released: 06/12/2003 Document Revised: 12/28/2015 Document Reviewed: 11/19/2015 Elsevier Interactive Patient Education   2017 Elsevier Inc.  

## 2017-06-11 NOTE — Progress Notes (Signed)
Chief Complaint  Patient presents with  . Follow-up    3 month f/u diabetes check    HPI   Diabetes Mellitus: Patient presents for follow up of diabetes. Symptoms: hyperglycemia. Symptoms have stabilized. Patient denies hypoglycemia , increase appetite, nausea, polydipsia and polyuria.  Evaluation to date has been included: hemoglobin A1C.  Home sugars: BGs consistently in an acceptable range. Treatment to date: Increased dose of metformin which has been too early to assess effectiveness.  Lab Results  Component Value Date   HGBA1C 8.7 06/11/2017   She has been cutting her metformin 1000mg  in half and taking 500mg  twice a day  Dyslipidemia: Patient presents for evaluation of lipids.  Compliance with treatment thus far has been excellent.  A repeat fasting lipid profile was not done.  The patient does not use medications that may worsen dyslipidemias (corticosteroids, progestins, anabolic steroids, diuretics, beta-blockers, amiodarone, cyclosporine, olanzapine). The patient exercises three times a week.  The patient is not known to have coexisting coronary artery disease.   She takes her crestor daily instead of every other day    Lab Results  Component Value Date   CHOL 227 (H) 10/22/2016   Lab Results  Component Value Date   HDL 54 10/22/2016   Lab Results  Component Value Date   LDLCALC 147 (H) 10/22/2016   Lab Results  Component Value Date   TRIG 129 10/22/2016   Lab Results  Component Value Date   CHOLHDL 4.2 10/22/2016   No results found for: LDLDIRECT   Past Medical History:  Diagnosis Date  . Diabetes mellitus without complication ([place])   . High cholesterol     Current Outpatient Medications  Medication Sig Dispense Refill  . aspirin EC 81 MG tablet Take 324 mg by mouth once.    Marland Kitchen glucose blood test strip E11.9. Test blood glucose three times a day. Use as instructed 100 each 12  . rosuvastatin (CRESTOR) 10 MG tablet Take 1 tablet (10 mg total) by  mouth daily. 90 tablet 3  . tetrahydrozoline 0.05 % ophthalmic solution Place 1 drop into both eyes as needed. For dry eyes.    . metFORMIN (GLUCOPHAGE) 1000 MG tablet Take 1 tablet (1,000 mg total) by mouth 2 (two) times daily with a meal. 180 tablet 0   No current facility-administered medications for this visit.     Allergies: No Known Allergies  Past Surgical History:  Procedure Laterality Date  . ABDOMINAL HYSTERECTOMY      Social History   Socioeconomic History  . Marital status: Married    Spouse name: None  . Number of children: None  . Years of education: None  . Highest education level: None  Social Needs  . Financial resource strain: None  . Food insecurity - worry: None  . Food insecurity - inability: None  . Transportation needs - medical: None  . Transportation needs - non-medical: None  Occupational History  . None  Tobacco Use  . Smoking status: Never Smoker  . Smokeless tobacco: Never Used  Substance and Sexual Activity  . Alcohol use: No  . Drug use: No  . Sexual activity: No  Other Topics Concern  . None  Social History Narrative  . None    Family History  Problem Relation Age of Onset  . Diabetes Mother   . Diabetes Sister   . Breast cancer Neg Hx      ROS Review of Systems See HPI Constitution: No fevers or chills No malaise No  diaphoresis Skin: No rash or itching Eyes: no blurry vision, no double vision GU: no dysuria or hematuria Neuro: no dizziness or headaches all others reviewed and negative   Objective: Vitals:   06/11/17 1119  BP: 130/84  Pulse: 82  Resp: 16  Temp: 98.2 F (36.8 C)  TempSrc: Oral  SpO2: 98%  Weight: 132 lb 9.6 oz (60.1 kg)  Height: 5' 5.5" (1.664 m)    Physical Exam  Constitutional: She is oriented to person, place, and time. She appears well-developed and well-nourished.  HENT:  Head: Normocephalic and atraumatic.  Right Ear: External ear normal.  Left Ear: External ear normal.  Nose: Nose  normal.  Mouth/Throat: Oropharynx is clear and moist.  Eyes: Conjunctivae and EOM are normal.  Neck: Normal range of motion. Neck supple.  Cardiovascular: Normal rate, regular rhythm and normal heart sounds.  No murmur heard. Pulmonary/Chest: Effort normal and breath sounds normal. No stridor. No respiratory distress.  Abdominal: Soft. Bowel sounds are normal. She exhibits no distension and no mass. There is no tenderness. There is no guarding.  Neurological: She is alert and oriented to person, place, and time.  Skin: Skin is warm. Capillary refill takes less than 2 seconds.  Psychiatric: She has a normal mood and affect. Her behavior is normal. Judgment and thought content normal.    Assessment and Plan Briyanna was seen today for follow-up.  Diagnoses and all orders for this visit:  Type 2 diabetes mellitus with complication, without long-term current use of insulin ([place])- pt was taken off amaryl and tradjenta and reports that her sugars are still better now than before when she was on all those medications  -     POCT glycosylated hemoglobin (Hb A1C) -     metFORMIN (GLUCOPHAGE) 1000 MG tablet; Take 1 tablet (1,000 mg total) by mouth 2 (two) times daily with a meal.  Dyslipidemia- will check in 3 months when pt follows up Due to her holiday eating she is taking crestor daily   Other orders -     Discontinue: rosuvastatin (CRESTOR) 10 MG tablet; Take 1 tablet (10 mg total) by mouth every other day. -     rosuvastatin (CRESTOR) 10 MG tablet; Take 1 tablet (10 mg total) by mouth daily.  Encounter for Medication Monitoring - pt to return for lipid panel and CMP in 3 months as well as repeat [place]

## 2017-07-23 NOTE — Progress Notes (Signed)
Chief Complaint  Patient presents with  . annual wellness visit    follow up/ rescheduled ov due to dr Nolon Rod schedule    HPI   Diabetes Mellitus: Patient presents for follow up of diabetes. Symptoms: gas distenstion and abdominal bloating. Symptoms have stabilized. Patient denies foot ulcerations, hyperglycemia, hypoglycemia , nausea and vomitting.  Evaluation to date has been included: hemoglobin A1C.  Home sugars: BGs consistently in an acceptable range. Treatment to date: Increased dose of metformin which has been effective.   Lab Results  Component Value Date   HGBA1C 8.3 07/27/2017   Decreased hearing and Rhinitis Pt reports decreased hearing on the left compared to the right She also complains of runny nose that is bothersome  She reports that her symptoms are worse in the winter when she feels congested Overall she has noticed reduced hearing for years but only noticed the runny nose in the past 6 months    Past Medical History:  Diagnosis Date  . Diabetes mellitus without complication ([place])   . High cholesterol     Current Outpatient Medications  Medication Sig Dispense Refill  . aspirin EC 81 MG tablet Take 324 mg by mouth once.    . fluticasone (FLONASE) 50 MCG/ACT nasal spray Place 2 sprays into both nostrils daily. 16 g 6  . glucose blood test strip E11.9. Test blood glucose three times a day. Use as instructed 100 each 12  . metFORMIN (GLUCOPHAGE) 1000 MG tablet Take 1 tablet (1,000 mg total) by mouth 2 (two) times daily with a meal. 180 tablet 0  . rosuvastatin (CRESTOR) 10 MG tablet Take 1 tablet (10 mg total) by mouth daily. 90 tablet 3  . tetrahydrozoline 0.05 % ophthalmic solution Place 1 drop into both eyes as needed. For dry eyes.     No current facility-administered medications for this visit.     Allergies: No Known Allergies  Past Surgical History:  Procedure Laterality Date  . ABDOMINAL HYSTERECTOMY      Social History   Socioeconomic  History  . Marital status: Legally Separated    Spouse name: None  . Number of children: 1  . Years of education: None  . Highest education level: Master's degree (e.g., MA, MS, MEng, MEd, MSW, MBA)  Social Needs  . Financial resource strain: Not hard at all  . Food insecurity - worry: Never true  . Food insecurity - inability: Never true  . Transportation needs - medical: No  . Transportation needs - non-medical: No  Occupational History  . None  Tobacco Use  . Smoking status: Never Smoker  . Smokeless tobacco: Never Used  Substance and Sexual Activity  . Alcohol use: Yes    Comment: red wine occasional   . Drug use: No  . Sexual activity: No  Other Topics Concern  . None  Social History Narrative  . None    Family History  Problem Relation Age of Onset  . Diabetes Mother   . Diabetes Sister   . Breast cancer Neg Hx      ROS Review of Systems See HPI Constitution: No fevers or chills No malaise No diaphoresis Skin: No rash or itching Eyes: no blurry vision, no double vision GU: no dysuria or hematuria Neuro: no dizziness or headaches  all others reviewed and negative   Objective: Vitals:   07/27/17 1017  BP: 128/72  Pulse: 77  Resp: 16  Temp: 98.6 F (37 C)  TempSrc: Oral  SpO2: 98%  Weight: 131  lb 3.2 oz (59.5 kg)  Height: 5\' 6"  (1.676 m)    Physical Exam General: alert, oriented, in NAD Head: normocephalic, atraumatic, no sinus tenderness Eyes: EOM intact, no scleral icterus or conjunctival injection Ears: TM clear bilaterally Nose: mucosa nonerythematous, nonedematous Throat: no pharyngeal exudate or erythema Lymph: no posterior auricular, submental or cervical lymph adenopathy Heart: normal rate, normal sinus rhythm, no murmurs Lungs: clear to auscultation bilaterally, no wheezing Abdomen: nondistended, normoactive bs, soft, nontender  Assessment and Plan Megan Wolfe was seen today for annual wellness visit.  Diagnoses and all orders for  this visit:  Decreased hearing of left ear- will refer for audiology and for hearing assessment -     Ambulatory referral to ENT  Chronic rhinitis- advised to try flonase  -     Ambulatory referral to ENT -     fluticasone (FLONASE) 50 MCG/ACT nasal spray; Place 2 sprays into both nostrils daily.  Type 2 diabetes mellitus with complication, without long-term current use of insulin ([place])- metformin 8.3% showing improvement Goal a1c<8% Pt to continue current dose -     POCT glycosylated hemoglobin (Hb A1C) -     Lipid panel -     Comprehensive metabolic panel -     CBC  Encounter for medication monitoring     Megan Wolfe A Megan Wolfe

## 2017-07-27 ENCOUNTER — Ambulatory Visit (INDEPENDENT_AMBULATORY_CARE_PROVIDER_SITE_OTHER): Payer: Medicare Other | Admitting: Family Medicine

## 2017-07-27 ENCOUNTER — Encounter: Payer: Self-pay | Admitting: Family Medicine

## 2017-07-27 ENCOUNTER — Ambulatory Visit (INDEPENDENT_AMBULATORY_CARE_PROVIDER_SITE_OTHER): Payer: Medicare Other

## 2017-07-27 ENCOUNTER — Telehealth: Payer: Self-pay

## 2017-07-27 ENCOUNTER — Other Ambulatory Visit: Payer: Self-pay

## 2017-07-27 ENCOUNTER — Ambulatory Visit: Payer: Medicare Other | Admitting: Family Medicine

## 2017-07-27 VITALS — BP 128/72 | HR 77 | Temp 98.6°F | Resp 16 | Ht 66.0 in | Wt 131.2 lb

## 2017-07-27 VITALS — BP 128/72 | HR 77 | Ht 66.0 in | Wt 131.1 lb

## 2017-07-27 DIAGNOSIS — J310 Chronic rhinitis: Secondary | ICD-10-CM

## 2017-07-27 DIAGNOSIS — Z5181 Encounter for therapeutic drug level monitoring: Secondary | ICD-10-CM | POA: Diagnosis not present

## 2017-07-27 DIAGNOSIS — H9192 Unspecified hearing loss, left ear: Secondary | ICD-10-CM

## 2017-07-27 DIAGNOSIS — E118 Type 2 diabetes mellitus with unspecified complications: Secondary | ICD-10-CM

## 2017-07-27 DIAGNOSIS — Z0000 Encounter for general adult medical examination without abnormal findings: Secondary | ICD-10-CM

## 2017-07-27 LAB — POCT GLYCOSYLATED HEMOGLOBIN (HGB A1C): Hemoglobin A1C: 8.3

## 2017-07-27 MED ORDER — FLUTICASONE PROPIONATE 50 MCG/ACT NA SUSP: 2.0000 | Freq: Every day | NASAL | 6 refills | Status: DC

## 2017-07-27 NOTE — Patient Instructions (Addendum)
Megan Wolfe , Thank you for taking time to come for your Medicare Wellness Visit. I appreciate your ongoing commitment to your health goals. Please review the following plan we discussed and let me know if I can assist you in the future.   Screening recommendations/referrals: Colonoscopy: up to date, next due 07/23/2021 Mammogram: up to date, next due 12/02/2018 Bone Density: declined Recommended yearly ophthalmology/optometry visit for glaucoma screening and checkup Recommended yearly dental visit for hygiene and checkup  Vaccinations: Influenza vaccine: up to date  Pneumococcal vaccine: up to date Tdap vaccine: declined due to insurance Shingles vaccine: Check with your pharmacy about receiving the Shingrix vaccine.      Advanced directives: Advance directive discussed with you today. I have provided a copy for you to complete at home and have notarized. Once this is complete please bring a copy in to our office so we can scan it into your chart.  Conditions/risks identified: Continue maintaining your current water intake daily.   Next appointment: today @ 10:20 am with Dr. Nolon Rod, next AWV in 1 year    Preventive Care 74 Years and Older, Female Preventive care refers to lifestyle choices and visits with your health care provider that can promote health and wellness. What does preventive care include?  A yearly physical exam. This is also called an annual well check.  Dental exams once or twice a year.  Routine eye exams. Ask your health care provider how often you should have your eyes checked.  Personal lifestyle choices, including:  Daily care of your teeth and gums.  Regular physical activity.  Eating a healthy diet.  Avoiding tobacco and drug use.  Limiting alcohol use.  Practicing safe sex.  Taking low-dose aspirin every day.  Taking vitamin and mineral supplements as recommended by your health care provider. What happens during an annual well check? The  services and screenings done by your health care provider during your annual well check will depend on your age, overall health, lifestyle risk factors, and family history of disease. Counseling  Your health care provider may ask you questions about your:  Alcohol use.  Tobacco use.  Drug use.  Emotional well-being.  Home and relationship well-being.  Sexual activity.  Eating habits.  History of falls.  Memory and ability to understand (cognition).  Work and work Statistician.  Reproductive health. Screening  You may have the following tests or measurements:  Height, weight, and BMI.  Blood pressure.  Lipid and cholesterol levels. These may be checked every 5 years, or more frequently if you are over 26 years old.  Skin check.  Lung cancer screening. You may have this screening every year starting at age 32 if you have a 30-pack-year history of smoking and currently smoke or have quit within the past 15 years.  Fecal occult blood test (FOBT) of the stool. You may have this test every year starting at age 15.  Flexible sigmoidoscopy or colonoscopy. You may have a sigmoidoscopy every 5 years or a colonoscopy every 10 years starting at age 48.  Hepatitis C blood test.  Hepatitis B blood test.  Sexually transmitted disease (STD) testing.  Diabetes screening. This is done by checking your blood sugar (glucose) after you have not eaten for a while (fasting). You may have this done every 1-3 years.  Bone density scan. This is done to screen for osteoporosis. You may have this done starting at age 33.  Mammogram. This may be done every 1-2 years. Talk to your  health care provider about how often you should have regular mammograms. Talk with your health care provider about your test results, treatment options, and if necessary, the need for more tests. Vaccines  Your health care provider may recommend certain vaccines, such as:  Influenza vaccine. This is recommended  every year.  Tetanus, diphtheria, and acellular pertussis (Tdap, Td) vaccine. You may need a Td booster every 10 years.  Zoster vaccine. You may need this after age 90.  Pneumococcal 13-valent conjugate (PCV13) vaccine. One dose is recommended after age 36.  Pneumococcal polysaccharide (PPSV23) vaccine. One dose is recommended after age 61. Talk to your health care provider about which screenings and vaccines you need and how often you need them. This information is not intended to replace advice given to you by your health care provider. Make sure you discuss any questions you have with your health care provider. Document Released: 07/06/2015 Document Revised: 02/27/2016 Document Reviewed: 04/10/2015 Elsevier Interactive Patient Education  2017 [place] Prevention in the Home Falls can cause injuries. They can happen to people of all ages. There are many things you can do to make your home safe and to help prevent falls. What can I do on the outside of my home?  Regularly fix the edges of walkways and driveways and fix any cracks.  Remove anything that might make you trip as you walk through a door, such as a raised step or threshold.  Trim any bushes or trees on the path to your home.  Use bright outdoor lighting.  Clear any walking paths of anything that might make someone trip, such as rocks or tools.  Regularly check to see if handrails are loose or broken. Make sure that both sides of any steps have handrails.  Any raised decks and porches should have guardrails on the edges.  Have any leaves, snow, or ice cleared regularly.  Use sand or salt on walking paths during winter.  Clean up any spills in your garage right away. This includes oil or grease spills. What can I do in the bathroom?  Use night lights.  Install grab bars by the toilet and in the tub and shower. Do not use towel bars as grab bars.  Use non-skid mats or decals in the tub or shower.  If  you need to sit down in the shower, use a plastic, non-slip stool.  Keep the floor dry. Clean up any water that spills on the floor as soon as it happens.  Remove soap buildup in the tub or shower regularly.  Attach bath mats securely with double-sided non-slip rug tape.  Do not have throw rugs and other things on the floor that can make you trip. What can I do in the bedroom?  Use night lights.  Make sure that you have a light by your bed that is easy to reach.  Do not use any sheets or blankets that are too big for your bed. They should not hang down onto the floor.  Have a firm chair that has side arms. You can use this for support while you get dressed.  Do not have throw rugs and other things on the floor that can make you trip. What can I do in the kitchen?  Clean up any spills right away.  Avoid walking on wet floors.  Keep items that you use a lot in easy-to-reach places.  If you need to reach something above you, use a strong step stool that has a  grab bar.  Keep electrical cords out of the way.  Do not use floor polish or wax that makes floors slippery. If you must use wax, use non-skid floor wax.  Do not have throw rugs and other things on the floor that can make you trip. What can I do with my stairs?  Do not leave any items on the stairs.  Make sure that there are handrails on both sides of the stairs and use them. Fix handrails that are broken or loose. Make sure that handrails are as long as the stairways.  Check any carpeting to make sure that it is firmly attached to the stairs. Fix any carpet that is loose or worn.  Avoid having throw rugs at the top or bottom of the stairs. If you do have throw rugs, attach them to the floor with carpet tape.  Make sure that you have a light switch at the top of the stairs and the bottom of the stairs. If you do not have them, ask someone to add them for you. What else can I do to help prevent falls?  Wear shoes  that:  Do not have high heels.  Have rubber bottoms.  Are comfortable and fit you well.  Are closed at the toe. Do not wear sandals.  If you use a stepladder:  Make sure that it is fully opened. Do not climb a closed stepladder.  Make sure that both sides of the stepladder are locked into place.  Ask someone to hold it for you, if possible.  Clearly mark and make sure that you can see:  Any grab bars or handrails.  First and last steps.  Where the edge of each step is.  Use tools that help you move around (mobility aids) if they are needed. These include:  Canes.  Walkers.  Scooters.  Crutches.  Turn on the lights when you go into a dark area. Replace any light bulbs as soon as they burn out.  Set up your furniture so you have a clear path. Avoid moving your furniture around.  If any of your floors are uneven, fix them.  If there are any pets around you, be aware of where they are.  Review your medicines with your doctor. Some medicines can make you feel dizzy. This can increase your chance of falling. Ask your doctor what other things that you can do to help prevent falls. This information is not intended to replace advice given to you by your health care provider. Make sure you discuss any questions you have with your health care provider. Document Released: 04/05/2009 Document Revised: 11/15/2015 Document Reviewed: 07/14/2014 Elsevier Interactive Patient Education  2017 [employer].

## 2017-07-27 NOTE — Patient Instructions (Addendum)
IF you received an x-ray today, you will receive an invoice from [facility]. Please contact [facility] at [phone] with questions or concerns regarding your invoice.   IF you received labwork today, you will receive an invoice from [place]. Please contact [facility] at [phone] with questions or concerns regarding your invoice.   Our billing staff will not be able to assist you with questions regarding bills from these companies.  You will be contacted with the lab results as soon as they are available. The fastest way to get your results is to activate your My Chart account. Instructions are located on the last page of this paperwork. If you have not heard from [place] regarding the results in 2 weeks, please contact this office.     Hearing Loss Hearing loss is a partial or total loss of the ability to hear. This can be temporary or permanent, and it can happen in one or both ears. Hearing loss may be referred to as deafness. Medical care is necessary to treat hearing loss properly and to prevent the condition from getting worse. Your hearing may partially or completely come back, depending on what caused your hearing loss and how severe it is. In some cases, hearing loss is permanent. What are the causes? Common causes of hearing loss include:  Too much wax in the ear canal.  Infection of the ear canal or middle ear.  Fluid in the middle ear.  Injury to the ear or surrounding area.  An object stuck in the ear.  Prolonged exposure to loud sounds, such as music.  Less common causes of hearing loss include:  Tumors in the ear.  Viral or bacterial infections, such as meningitis.  A hole in the eardrum (perforated eardrum).  Problems with the hearing nerve that sends signals between the brain and the ear.  Certain medicines.  What are the signs or symptoms? Symptoms of this condition may include:  Difficulty telling the difference between  sounds.  Difficulty following a conversation when there is background noise.  Lack of response to sounds in your environment. This may be most noticeable when you do not respond to startling sounds.  Needing to turn up the volume on the television, radio, etc.  Ringing in the ears.  Dizziness.  Pain in the ears.  How is this diagnosed? This condition is diagnosed based on a physical exam and a hearing test (audiometry). The audiometry test will be performed by a hearing specialist (audiologist). You may also be referred to an ear, nose, and throat (ENT) specialist (otolaryngologist). How is this treated? Treatment for recent onset of hearing loss may include:  Ear wax removal.  Being prescribed medicines to prevent infection (antibiotics).  Being prescribed medicines to reduce inflammation (corticosteroids).  Follow these instructions at home:  If you were prescribed an antibiotic medicine, take it as told by your health care provider. Do not stop taking the antibiotic even if you start to feel better.  Take over-the-counter and prescription medicines only as told by your health care provider.  Avoid loud noises.  Return to your normal activities as told by your health care provider. Ask your health care provider what activities are safe for you.  Keep all follow-up visits as told by your health care provider. This is important. Contact a health care provider if:  You feel dizzy.  You develop new symptoms.  You vomit or feel nauseous.  You have a fever. Get help right away if:  You develop sudden changes in your vision.  You have severe ear pain.  You have new or increased weakness.  You have a severe headache. This information is not intended to replace advice given to you by your health care provider. Make sure you discuss any questions you have with your health care provider. Document Released: 06/09/2005 Document Revised: 11/15/2015 Document Reviewed:  10/25/2014 Elsevier Interactive Patient Education  2018 [employer].

## 2017-07-27 NOTE — Progress Notes (Signed)
Subjective:   Megan Wolfe is a 82 y.o. female who presents for Medicare Annual (Subsequent) preventive examination.  Review of Systems:  N/A Cardiac Risk Factors include: advanced age (>77men, >45 women);diabetes mellitus;dyslipidemia     Objective:     Vitals: BP 128/72   Pulse 77   Ht 5\' 6"  (1.676 m)   Wt 131 lb 2 oz (59.5 kg)   SpO2 98%   BMI 21.16 kg/m   Body mass index is 21.16 kg/m.  Advanced Directives 07/27/2017 01/23/2017  Does Patient Have a Medical Advance Directive? No No  Would patient like information on creating a medical advance directive? Yes (MAU/Ambulatory/Procedural Areas - Information given) No - Patient declined    Tobacco Social History   Tobacco Use  Smoking Status Never Smoker  Smokeless Tobacco Never Used     Counseling given: Not Answered   Clinical Intake:  Pre-visit preparation completed: Yes  Pain : No/denies pain     Nutritional Status: BMI of 19-24  Normal Nutritional Risks: None Diabetes: Yes CBG done?: No Did pt. bring in CBG monitor from home?: No  How often do you need to have someone help you when you read instructions, pamphlets, or other written materials from your doctor or pharmacy?: 1 - Never What is the last grade level you completed in school?: Masters Degree  Interpreter Needed?: No  Information entered by :: Andrez Grime, LPN  Past Medical History:  Diagnosis Date  . Diabetes mellitus without complication ([place])   . High cholesterol    Past Surgical History:  Procedure Laterality Date  . ABDOMINAL HYSTERECTOMY     Family History  Problem Relation Age of Onset  . Diabetes Mother   . Diabetes Sister   . Breast cancer Neg Hx    Social History   Socioeconomic History  . Marital status: Legally Separated    Spouse name: None  . Number of children: 1  . Years of education: None  . Highest education level: Master's degree (e.g., MA, MS, MEng, MEd, MSW, MBA)  Social Needs  . Financial  resource strain: Not hard at all  . Food insecurity - worry: Never true  . Food insecurity - inability: Never true  . Transportation needs - medical: No  . Transportation needs - non-medical: No  Occupational History  . None  Tobacco Use  . Smoking status: Never Smoker  . Smokeless tobacco: Never Used  Substance and Sexual Activity  . Alcohol use: Yes    Comment: red wine occasional   . Drug use: No  . Sexual activity: No  Other Topics Concern  . None  Social History Narrative  . None    Outpatient Encounter Medications as of 07/27/2017  Medication Sig  . aspirin EC 81 MG tablet Take 324 mg by mouth once.  Marland Kitchen glucose blood test strip E11.9. Test blood glucose three times a day. Use as instructed  . metFORMIN (GLUCOPHAGE) 1000 MG tablet Take 1 tablet (1,000 mg total) by mouth 2 (two) times daily with a meal.  . rosuvastatin (CRESTOR) 10 MG tablet Take 1 tablet (10 mg total) by mouth daily.  Marland Kitchen tetrahydrozoline 0.05 % ophthalmic solution Place 1 drop into both eyes as needed. For dry eyes.   No facility-administered encounter medications on file as of 07/27/2017.     Activities of Daily Living In your present state of health, do you have any difficulty performing the following activities: 07/27/2017  Hearing? Y  Comment Patient has hearing issues. Trouble  hearing the TV sometimes.   Vision? Y  Comment Patient has issues with eyes tearing all the time.   Difficulty concentrating or making decisions? Y  Comment Patient has had issues with remembering things all her life.   Walking or climbing stairs? N  Dressing or bathing? N  Doing errands, shopping? N  Preparing Food and eating ? N  Using the Toilet? N  In the past six months, have you accidently leaked urine? N  Do you have problems with loss of bowel control? N  Managing your Medications? N  Managing your Finances? N  Housekeeping or managing your Housekeeping? N  Some recent data might be hidden    Patient Care  Team: Forrest Moron, MD as PCP - General (Internal Medicine) Rutherford Guys, MD as Consulting Physician (Ophthalmology)    Assessment:   This is a routine wellness examination for Megan Wolfe.  Exercise Activities and Dietary recommendations Current Exercise Habits: Home exercise routine, Type of exercise: walking, Time (Minutes): 60, Frequency (Times/Week): 3, Weekly Exercise (Minutes/Week): 180, Exercise limited by: None identified  Goals    . DIET - INCREASE WATER INTAKE     Patient states that she will continue maintaining her current water intake daily.        Fall Risk Fall Risk  07/27/2017 06/11/2017 01/20/2017 10/22/2016  Falls in the past year? No No No No   Is the patient's home free of loose throw rugs in walkways, pet beds, electrical cords, etc?   yes      Grab bars in the bathroom? no      Handrails on the stairs?   no      Adequate lighting?   yes  Timed Get Up and Go performed: yes, com;pleted within 30 seconds   Depression Screen PHQ 2/9 Scores 07/27/2017 06/11/2017 01/20/2017 10/22/2016  PHQ - 2 Score 0 0 0 0     Cognitive Function     6CIT Screen 07/27/2017  What Year? 0 points  What month? 0 points  What time? 0 points  Count back from 20 0 points  Months in reverse 0 points  Repeat phrase 8 points  Total Score 8    Immunization History  Administered Date(s) Administered  . Influenza,inj,Quad PF,6+ Mos 02/20/2017  . Pneumococcal Conjugate-13 06/05/2014  . Pneumococcal Polysaccharide-23 04/12/2007    Qualifies for Shingles Vaccine?Advised patient to check with her pharmacy about receiving the Shingrix vaccine  Screening Tests Health Maintenance  Topic Date Due  . DEXA SCAN  07/27/2018 (Originally 08/06/2002)  . TETANUS/TDAP  07/27/2018 (Originally 08/06/1954)  . FOOT EXAM  10/22/2017  . URINE MICROALBUMIN  10/22/2017  . OPHTHALMOLOGY EXAM  10/30/2017  . HEMOGLOBIN A1C  12/10/2017  . INFLUENZA VACCINE  Completed  . PNA vac Low Risk Adult  Completed     Cancer Screenings: Lung: Low Dose CT Chest recommended if Age 65-80 years, 30 pack-year currently smoking OR have quit w/in 15years. Patient does not qualify. Breast:  Up to date on Mammogram? Yes, completed 12/01/2016   Up to date of Bone Density/Dexa? No, Patient declined at this time Colorectal: colonoscopy completed 07/24/2011  Additional Screenings:  Hepatitis B/HIV/Syphillis: not indicated  Hepatitis C Screening: not indicated  Patient declined Tetanus shot.     Plan:   I have personally reviewed and noted the following in the patient's chart:   . Medical and social history . Use of alcohol, tobacco or illicit drugs  . Current medications and supplements . Functional ability and status .  Nutritional status . Physical activity . Advanced directives . List of other physicians . Hospitalizations, surgeries, and ER visits in previous 12 months . Vitals . Screenings to include cognitive, depression, and falls . Referrals and appointments  In addition, I have reviewed and discussed with patient certain preventive protocols, quality metrics, and best practice recommendations. A written personalized care plan for preventive services as well as general preventive health recommendations were provided to patient.   1. Encounter for Medicare annual wellness exam    Andrez Grime, LPN  11/28/1243

## 2017-07-27 NOTE — Telephone Encounter (Signed)
Spoke with pt via phone advised A1C 8.3 and per dr stalling this is better and to continue diabetes medication as directed.  Pt agreeable- advised will contact her with other lab results as soon as they come in. Dgaddy, CMA

## 2017-07-28 LAB — CBC
HEMOGLOBIN: 12.6 g/dL (ref 11.1–15.9)
Hematocrit: 39 % (ref 34.0–46.6)
MCH: 29.6 pg (ref 26.6–33.0)
MCHC: 32.3 g/dL (ref 31.5–35.7)
MCV: 92 fL (ref 79–97)
PLATELETS: 269 10*3/uL (ref 150–379)
RBC: 4.26 x10E6/uL (ref 3.77–5.28)
RDW: 14.9 % (ref 12.3–15.4)
WBC: 5.5 10*3/uL (ref 3.4–10.8)

## 2017-07-28 LAB — COMPREHENSIVE METABOLIC PANEL
A/G RATIO: 1.2 (ref 1.2–2.2)
ALK PHOS: 81 IU/L (ref 39–117)
ALT: 16 IU/L (ref 0–32)
AST: 17 IU/L (ref 0–40)
Albumin: 4.1 g/dL (ref 3.5–4.8)
BUN/Creatinine Ratio: 16 (ref 12–28)
BUN: 11 mg/dL (ref 8–27)
Bilirubin Total: <0.2 mg/dL (ref 0.0–1.2)
CALCIUM: 9.4 mg/dL (ref 8.7–10.3)
CO2: 20 mmol/L (ref 20–29)
Chloride: 101 mmol/L (ref 96–106)
Creatinine, Ser: 0.67 mg/dL (ref 0.57–1.00)
GFR calc Af Amer: 97 mL/min/{1.73_m2} (ref >59)
GFR calc non Af Amer: 84 mL/min/{1.73_m2} (ref >59)
GLOBULIN, TOTAL: 3.3 g/dL (ref 1.5–4.5)
Glucose: 154 mg/dL — ABNORMAL HIGH (ref 65–99)
POTASSIUM: 4.7 mmol/L (ref 3.5–5.2)
Sodium: 139 mmol/L (ref 134–144)
Total Protein: 7.4 g/dL (ref 6.0–8.5)

## 2017-07-28 LAB — LIPID PANEL
CHOL/HDL RATIO: 3.4 ratio (ref 0.0–4.4)
CHOLESTEROL TOTAL: 175 mg/dL (ref 100–199)
HDL: 51 mg/dL (ref >39)
LDL CALC: 107 mg/dL — AB (ref 0–99)
Triglycerides: 83 mg/dL (ref 0–149)
VLDL CHOLESTEROL CAL: 17 mg/dL (ref 5–40)

## 2017-08-05 ENCOUNTER — Telehealth: Payer: Self-pay

## 2017-08-05 ENCOUNTER — Encounter: Payer: Self-pay | Admitting: Family Medicine

## 2017-08-05 NOTE — Telephone Encounter (Signed)
Pt lab results sent via mail to home address on file. Dgaddy, CMA

## 2017-09-07 ENCOUNTER — Other Ambulatory Visit: Payer: Self-pay | Admitting: Family Medicine

## 2017-09-07 DIAGNOSIS — E118 Type 2 diabetes mellitus with unspecified complications: Secondary | ICD-10-CM

## 2017-09-09 ENCOUNTER — Ambulatory Visit: Payer: Medicare Other | Admitting: Family Medicine

## 2017-10-21 DIAGNOSIS — H838X3 Other specified diseases of inner ear, bilateral: Secondary | ICD-10-CM | POA: Diagnosis not present

## 2017-10-21 DIAGNOSIS — H6121 Impacted cerumen, right ear: Secondary | ICD-10-CM | POA: Diagnosis not present

## 2017-10-21 DIAGNOSIS — H903 Sensorineural hearing loss, bilateral: Secondary | ICD-10-CM | POA: Diagnosis not present

## 2017-11-05 DIAGNOSIS — Z961 Presence of intraocular lens: Secondary | ICD-10-CM | POA: Diagnosis not present

## 2017-11-05 DIAGNOSIS — E119 Type 2 diabetes mellitus without complications: Secondary | ICD-10-CM | POA: Diagnosis not present

## 2017-11-05 LAB — HM DIABETES EYE EXAM

## 2018-01-06 ENCOUNTER — Other Ambulatory Visit: Payer: Self-pay | Admitting: Family Medicine

## 2018-01-06 DIAGNOSIS — E118 Type 2 diabetes mellitus with unspecified complications: Secondary | ICD-10-CM

## 2018-01-20 ENCOUNTER — Other Ambulatory Visit: Payer: Self-pay | Admitting: Family Medicine

## 2018-01-20 DIAGNOSIS — Z1231 Encounter for screening mammogram for malignant neoplasm of breast: Secondary | ICD-10-CM

## 2018-01-25 NOTE — Progress Notes (Signed)
Chief Complaint  Patient presents with  . 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  She had a history of ?tb in some glands but her xrays were negative and was treated in her 35s by taking a pill for six months.  She reports that she was told to stop doing the skin testing She denies exposure to anyone with tb No night sweats, hemoptysis, unexpected weight loss, fevers or chills  Diabetes Mellitus: Patient presents for follow up of diabetes. Symptoms: none. Patient denies hyperglycemia, hypoglycemia , nausea, polydipsia, polyuria and visual disturbances.  Evaluation to date has been included: hemoglobin A1C.  Home sugars: BGs consistently in an acceptable range. Treatment to date: Continued metformin which has been effective.  Lab Results  Component Value Date   HGBA1C 8.3 07/27/2017    Dyslipidemia: Patient presents for evaluation of lipids.  Compliance with treatment thus far has been good.  A repeat fasting lipid profile was done.  The patient does not use medications that may worsen dyslipidemias (corticosteroids, progestins, anabolic steroids, diuretics, beta-blockers, amiodarone, cyclosporine, olanzapine). The patient exercises by walking when it is not too hot.   The patient is not known to have coexisting coronary artery disease.   Lab Results  Component Value Date   CHOL 175 07/27/2017   CHOL 227 (H) 10/22/2016   Lab Results  Component Value Date   HDL 51 07/27/2017   HDL 54 10/22/2016   Lab Results  Component Value Date   LDLCALC 107 (H) 07/27/2017   LDLCALC 147 (H) 10/22/2016   Lab Results  Component Value Date   TRIG 83 07/27/2017   TRIG 129 10/22/2016   Lab Results  Component  Value Date   CHOLHDL 3.4 07/27/2017   CHOLHDL 4.2 10/22/2016   No results found for: LDLDIRECT    Past Medical History:  Diagnosis Date  . Diabetes mellitus without complication ([place])   . High cholesterol     Current Outpatient Medications  Medication Sig Dispense Refill  . aspirin EC 81 MG tablet Take 324 mg by mouth once.    . fluticasone (FLONASE) 50 MCG/ACT nasal spray Place 2 sprays into both nostrils daily. 16 g 6  . glucose blood test strip E11.9. Test blood glucose three times a day. Use as instructed 100 each 12  . metFORMIN (GLUCOPHAGE) 1000 MG tablet TAKE 1 TABLET (1,000 MG TOTAL) BY MOUTH 2 (TWO) TIMES DAILY WITH A MEAL. 180 tablet 0  . rosuvastatin (CRESTOR) 10 MG tablet Take 1 tablet (10 mg total) by mouth every other day. 45 tablet 3  . tetrahydrozoline 0.05 % ophthalmic solution Place 1 drop into both eyes as needed. For dry eyes.     No current facility-administered medications for this visit.     Allergies: No Known Allergies  Past Surgical History:  Procedure Laterality Date  . ABDOMINAL HYSTERECTOMY      Social History   Socioeconomic History  . Marital status: Legally Separated    Spouse name: Not on file  . Number of children: 1  . Years of education: Not on file  . Highest education level: Master's degree (e.g., MA,  MS, MEng, MEd, MSW, [employer] [facility] [employer])  Occupational History  . Not on file  Social Needs  . Financial resource strain: Not hard at all  . Food insecurity:    Worry: Never true    Inability: Never true  . Transportation needs:    Medical: No    Non-medical: No  Tobacco Use  . Smoking status: Never Smoker  . Smokeless tobacco: Never Used  Substance and Sexual Activity  . Alcohol use: Yes    Comment: red wine occasional   . Drug use: No  . Sexual activity: Never  Lifestyle  . Physical activity:    Days per week: 3 days    Minutes per session: 60 min  . Stress: Not at all  Relationships  . Social connections:    Talks on phone: More  than three times a week    Gets together: More than three times a week    Attends religious service: More than 4 times per year    Active member of club or organization: No    Attends meetings of clubs or organizations: Never    Relationship status: Separated  Other Topics Concern  . Not on file  Social History Narrative  . Not on file    Family History  Problem Relation Age of Onset  . Diabetes Mother   . Diabetes Sister   . Breast cancer Neg Hx      ROS Review of Systems See HPI Constitution: No fevers or chills No malaise No diaphoresis Skin: No rash or itching Eyes: no blurry vision, no double vision GU: no dysuria or hematuria Neuro: no dizziness or headaches  all others reviewed and negative   Objective: Vitals:   01/26/18 1015  BP: 116/70  Pulse: 92  Resp: 17  Temp: 98.9 F (37.2 C)  TempSrc: Oral  SpO2: 98%  Weight: 132 lb 9.6 oz (60.1 kg)  Height: 5\' 6"  (1.676 m)   Diabetic Foot Exam - Simple   Simple Foot Form Visual Inspection No deformities, no ulcerations, no other skin breakdown bilaterally:  Yes Sensation Testing Intact to touch and monofilament testing bilaterally:  Yes Pulse Check Posterior Tibialis and Dorsalis pulse intact bilaterally:  Yes Comments      Physical Exam  Constitutional: She is oriented to person, place, and time. She appears well-developed and well-nourished.  HENT:  Head: Normocephalic and atraumatic.  Eyes: Conjunctivae and EOM are normal.  Cardiovascular: Normal rate, regular rhythm, normal heart sounds and intact distal pulses.  No murmur heard. Pulmonary/Chest: Effort normal and breath sounds normal. No stridor. No respiratory distress. She has no wheezes.  Abdominal: Soft. Bowel sounds are normal. She exhibits no distension and no mass. There is no tenderness. There is no guarding.  Neurological: She is alert and oriented to person, place, and time.  Psychiatric: She has a normal mood and affect. Her behavior  is normal. Judgment and thought content normal.    Assessment and Plan [place] was seen today for chest x ray for tb per pt.  Diagnoses and all orders for this visit:  Type 2 diabetes mellitus without complication, without long-term current use of insulin (HCC) well controlled hemoglobin a1c is at goal Continue exercise Lipids monitored and renal function in range On metformin On asa 81mg  Reviewed diabetic foot care Emphasized importance of eye and dental exam    -     HM Diabetes Foot Exam -     Microalbumin, urine -     Lipid panel -  Comprehensive metabolic panel -     Hemoglobin A1c  Dyslipidemia- Discussed medications that affect lipids Reminded patient to avoid grapefruits Reviewed last 3 lipids Discussed current meds: statin, aspirin Advised dietary fiber and fish oil and ways to keep HDL high CAD prevention and reviewed side effects of statins Patient is a nonsmoker.  Smoking cessation discussed.  -     rosuvastatin (CRESTOR) 10 MG tablet; Take 1 tablet (10 mg total) by mouth every other day. -     Lipid panel -     Comprehensive metabolic panel  Screening-pulmonary TB- discussed tb screening -     QuantiFERON-TB Gold Plus     Jocelin Schuelke A [place]

## 2018-01-26 ENCOUNTER — Other Ambulatory Visit: Payer: Self-pay

## 2018-01-26 ENCOUNTER — Encounter: Payer: Self-pay | Admitting: Family Medicine

## 2018-01-26 ENCOUNTER — Ambulatory Visit (INDEPENDENT_AMBULATORY_CARE_PROVIDER_SITE_OTHER): Payer: Medicare Other | Admitting: Family Medicine

## 2018-01-26 VITALS — BP 116/70 | HR 92 | Temp 98.9°F | Resp 17 | Ht 66.0 in | Wt 132.6 lb

## 2018-01-26 DIAGNOSIS — Z111 Encounter for screening for respiratory tuberculosis: Secondary | ICD-10-CM

## 2018-01-26 DIAGNOSIS — E119 Type 2 diabetes mellitus without complications: Secondary | ICD-10-CM

## 2018-01-26 DIAGNOSIS — E785 Hyperlipidemia, unspecified: Secondary | ICD-10-CM | POA: Diagnosis not present

## 2018-01-26 MED ORDER — ROSUVASTATIN CALCIUM 10 MG PO TABS: 10.0000 mg | ORAL_TABLET | ORAL | 3 refills | Status: DC

## 2018-01-26 NOTE — Patient Instructions (Addendum)
     IF you received an x-ray today, you will receive an invoice from [facility]. Please contact [facility] at [phone] with questions or concerns regarding your invoice.   IF you received labwork today, you will receive an invoice from Home. Please contact [facility] at [phone] with questions or concerns regarding your invoice.   Our billing staff will not be able to assist you with questions regarding bills from these companies.  You will be contacted with the lab results as soon as they are available. The fastest way to get your results is to activate your My Chart account. Instructions are located on the last page of this paperwork. If you have not heard from [place] regarding the results in 2 weeks, please contact this office.     Tuberculosis Risk Questionnaire  1. No Were you born outside the [place] in one of the following parts of the world: [place], [place], [place], [place] or [place]?    2. No Have you traveled outside the [place] and lived for more than one month in one of the following parts of the world: [place], [place], [place], [place] or [place]?    3. No Do you have a compromised immune system such as from any of the following conditions:HIV/AIDS, organ or bone marrow transplantation, diabetes, immunosuppressive medicines (e.g. Prednisone, Remicaide), leukemia, lymphoma, cancer of the head or neck, gastrectomy or jejunal bypass, end-stage renal disease (on dialysis), or silicosis?     4. No Have you ever or do you plan on working in: a residential care center, a health care facility, a jail or prison or homeless shelter?    5. No Have you ever: injected illegal drugs, used crack cocaine, lived in a homeless shelter  or been in jail or prison?     6. No Have you ever been exposed to anyone with infectious tuberculosis?  7. No Have you ever had a BCG vaccine? (BCG is a vaccine for tuberculosis  (TB) used in OTHER  countries, NOT in the [place]).  8. No Have you ever been advised by a health care provider NOT to have a TB skin test?  9. No Have you ever had a POSITIVE TB skin test?  IF SO, when? n/a  IF SO, were you treated with INH? n/a  IF SO, where? n/a  Tuberculosis Symptom Questionnaire  Do you currently have any of the following symptoms?  1. No Unexplained cough lasting more than 3 weeks?   2. No Unexplained fever lasting more than 3 weeks.   3. No Night Sweats (sweating that leaves the bedclothes and sheets wet)     4. No Shortness of Breath   5. No Chest Pain   6. No Unintentional weight loss    7. No Unexplained fatigue (very tired for no reason)

## 2018-02-01 ENCOUNTER — Telehealth: Payer: Self-pay | Admitting: Family Medicine

## 2018-02-01 LAB — COMPREHENSIVE METABOLIC PANEL
ALT: 13 IU/L (ref 0–32)
AST: 13 IU/L (ref 0–40)
Albumin/Globulin Ratio: 1.4 (ref 1.2–2.2)
Albumin: 4.4 g/dL (ref 3.5–4.7)
Alkaline Phosphatase: 68 IU/L (ref 39–117)
BUN/Creatinine Ratio: 14 (ref 12–28)
BUN: 11 mg/dL (ref 8–27)
Bilirubin Total: 0.2 mg/dL (ref 0.0–1.2)
CALCIUM: 9.2 mg/dL (ref 8.7–10.3)
CHLORIDE: 105 mmol/L (ref 96–106)
CO2: 19 mmol/L — ABNORMAL LOW (ref 20–29)
Creatinine, Ser: 0.81 mg/dL (ref 0.57–1.00)
GFR, EST AFRICAN AMERICAN: 79 mL/min/{1.73_m2} (ref >59)
GFR, EST NON AFRICAN AMERICAN: 69 mL/min/{1.73_m2} (ref >59)
GLUCOSE: 206 mg/dL — AB (ref 65–99)
Globulin, Total: 3.1 g/dL (ref 1.5–4.5)
Potassium: 4.6 mmol/L (ref 3.5–5.2)
Sodium: 140 mmol/L (ref 134–144)
TOTAL PROTEIN: 7.5 g/dL (ref 6.0–8.5)

## 2018-02-01 LAB — QUANTIFERON-TB GOLD PLUS
QuantiFERON Mitogen Value: >10 IU/mL
QuantiFERON Nil Value: 0.15 IU/mL
QuantiFERON TB1 Ag Value: 0.71 IU/mL
QuantiFERON TB2 Ag Value: 0.56 IU/mL
QuantiFERON-TB Gold Plus: POSITIVE — AB

## 2018-02-01 LAB — LIPID PANEL
Chol/HDL Ratio: 4.7 ratio — ABNORMAL HIGH (ref 0.0–4.4)
Cholesterol, Total: 223 mg/dL — ABNORMAL HIGH (ref 100–199)
HDL: 47 mg/dL (ref >39)
LDL Calculated: 141 mg/dL — ABNORMAL HIGH (ref 0–99)
Triglycerides: 173 mg/dL — ABNORMAL HIGH (ref 0–149)
VLDL Cholesterol Cal: 35 mg/dL (ref 5–40)

## 2018-02-01 LAB — HEMOGLOBIN A1C
Est. average glucose Bld gHb Est-mCnc: 192 mg/dL
HEMOGLOBIN A1C: 8.3 % — AB (ref 4.8–5.6)

## 2018-02-01 LAB — MICROALBUMIN, URINE: MICROALBUM., U, RANDOM: 19.5 ug/mL

## 2018-02-01 NOTE — Telephone Encounter (Signed)
Copied from [place] [id number]. Topic: Quick Communication - Other Results >> Feb 01, 2018 10:39 AM Lennox Solders wrote: Pt is calling and would tb blood work results

## 2018-02-03 NOTE — Telephone Encounter (Signed)
Patient is requesting a update on her TB blood work, she stated she needs these result for work. Please advise

## 2018-02-04 NOTE — Telephone Encounter (Signed)
Pt arrived to office requesting results and explanation of results.  Advised that provider's ask for up to 2 weeks to review results and her results have not yet been reviewed.  Until reviewed and responded to, RN unable to provide instructions based on results.  Pt states her employer requested xray.  Requested that pt verify if employer could take results of existing test Percival Spanish) or if employer can provide a form for clinic to complete as this is what usually occurs.  Pt to find out but feels she needs xray.

## 2018-02-17 ENCOUNTER — Ambulatory Visit
Admission: RE | Admit: 2018-02-17 | Discharge: 2018-02-17 | Disposition: A | Discharge status: Home / Self Care | Payer: Medicare Other | Source: Ambulatory Visit | Attending: Family Medicine | Admitting: Family Medicine

## 2018-02-17 ENCOUNTER — Encounter: Payer: Self-pay | Admitting: Family Medicine

## 2018-02-17 DIAGNOSIS — Z1231 Encounter for screening mammogram for malignant neoplasm of breast: Secondary | ICD-10-CM

## 2018-03-12 ENCOUNTER — Encounter: Payer: Self-pay | Admitting: Family Medicine

## 2018-04-26 ENCOUNTER — Other Ambulatory Visit: Payer: Self-pay | Admitting: Family Medicine

## 2018-04-26 DIAGNOSIS — E118 Type 2 diabetes mellitus with unspecified complications: Secondary | ICD-10-CM

## 2018-04-26 NOTE — Telephone Encounter (Signed)
Requested Prescriptions  Pending Prescriptions Disp Refills  . metFORMIN (GLUCOPHAGE) 1000 MG tablet [Pharmacy Med Name: METFORMIN HCL 1,000 MG TABLET] 180 tablet 0    Sig: TAKE 1 TABLET (1,000 MG TOTAL) BY MOUTH 2 (TWO) TIMES DAILY WITH A MEAL.     Endocrinology:  Diabetes - Biguanides Failed - 04/26/2018  1:10 AM      Failed - HBA1C is between 0 and 7.9 and within 180 days    Hgb A1c MFr Bld  Date Value Ref Range Status  01/26/2018 8.3 (H) 4.8 - 5.6 % Final    Comment:             Prediabetes: 5.7 - 6.4          Diabetes: >6.4          Glycemic control for adults with diabetes: <7.0          Passed - Cr in normal range and within 360 days    Creatinine, Ser  Date Value Ref Range Status  01/26/2018 0.81 0.57 - 1.00 mg/dL Final         Passed - eGFR in normal range and within 360 days    GFR calc Af Amer  Date Value Ref Range Status  01/26/2018 79 >59 mL/min/1.73 Final   GFR calc non Af Amer  Date Value Ref Range Status  01/26/2018 69 >59 mL/min/1.73 Final         Passed - Valid encounter within last 6 months    Recent Outpatient Visits          3 months ago Type 2 diabetes mellitus without complication, without long-term current use of insulin ([place])   Primary Care at [facility], Zoe A, MD   9 months ago Decreased hearing of left ear   Primary Care at [facility], Zoe A, MD   10 months ago Type 2 diabetes mellitus with complication, without long-term current use of insulin ([place])   Primary Care at [facility], Zoe A, MD   1 year ago Type 2 diabetes mellitus without complication, without long-term current use of insulin ([place])   Primary Care at [facility], Zoe A, MD   1 year ago Type 2 diabetes mellitus without complication, without long-term current use of insulin ([place])   Primary Care at [facility], Arlie Solomons, MD

## 2018-06-09 IMAGING — CR DG CHEST 2V
2 series · 2 of 2 positions shown · non-contrast
Comparison: None.

CLINICAL DATA: Recent development of mid chest pain and shortness
of breath.

EXAM:
CHEST  2 VIEW

[w chest pa]
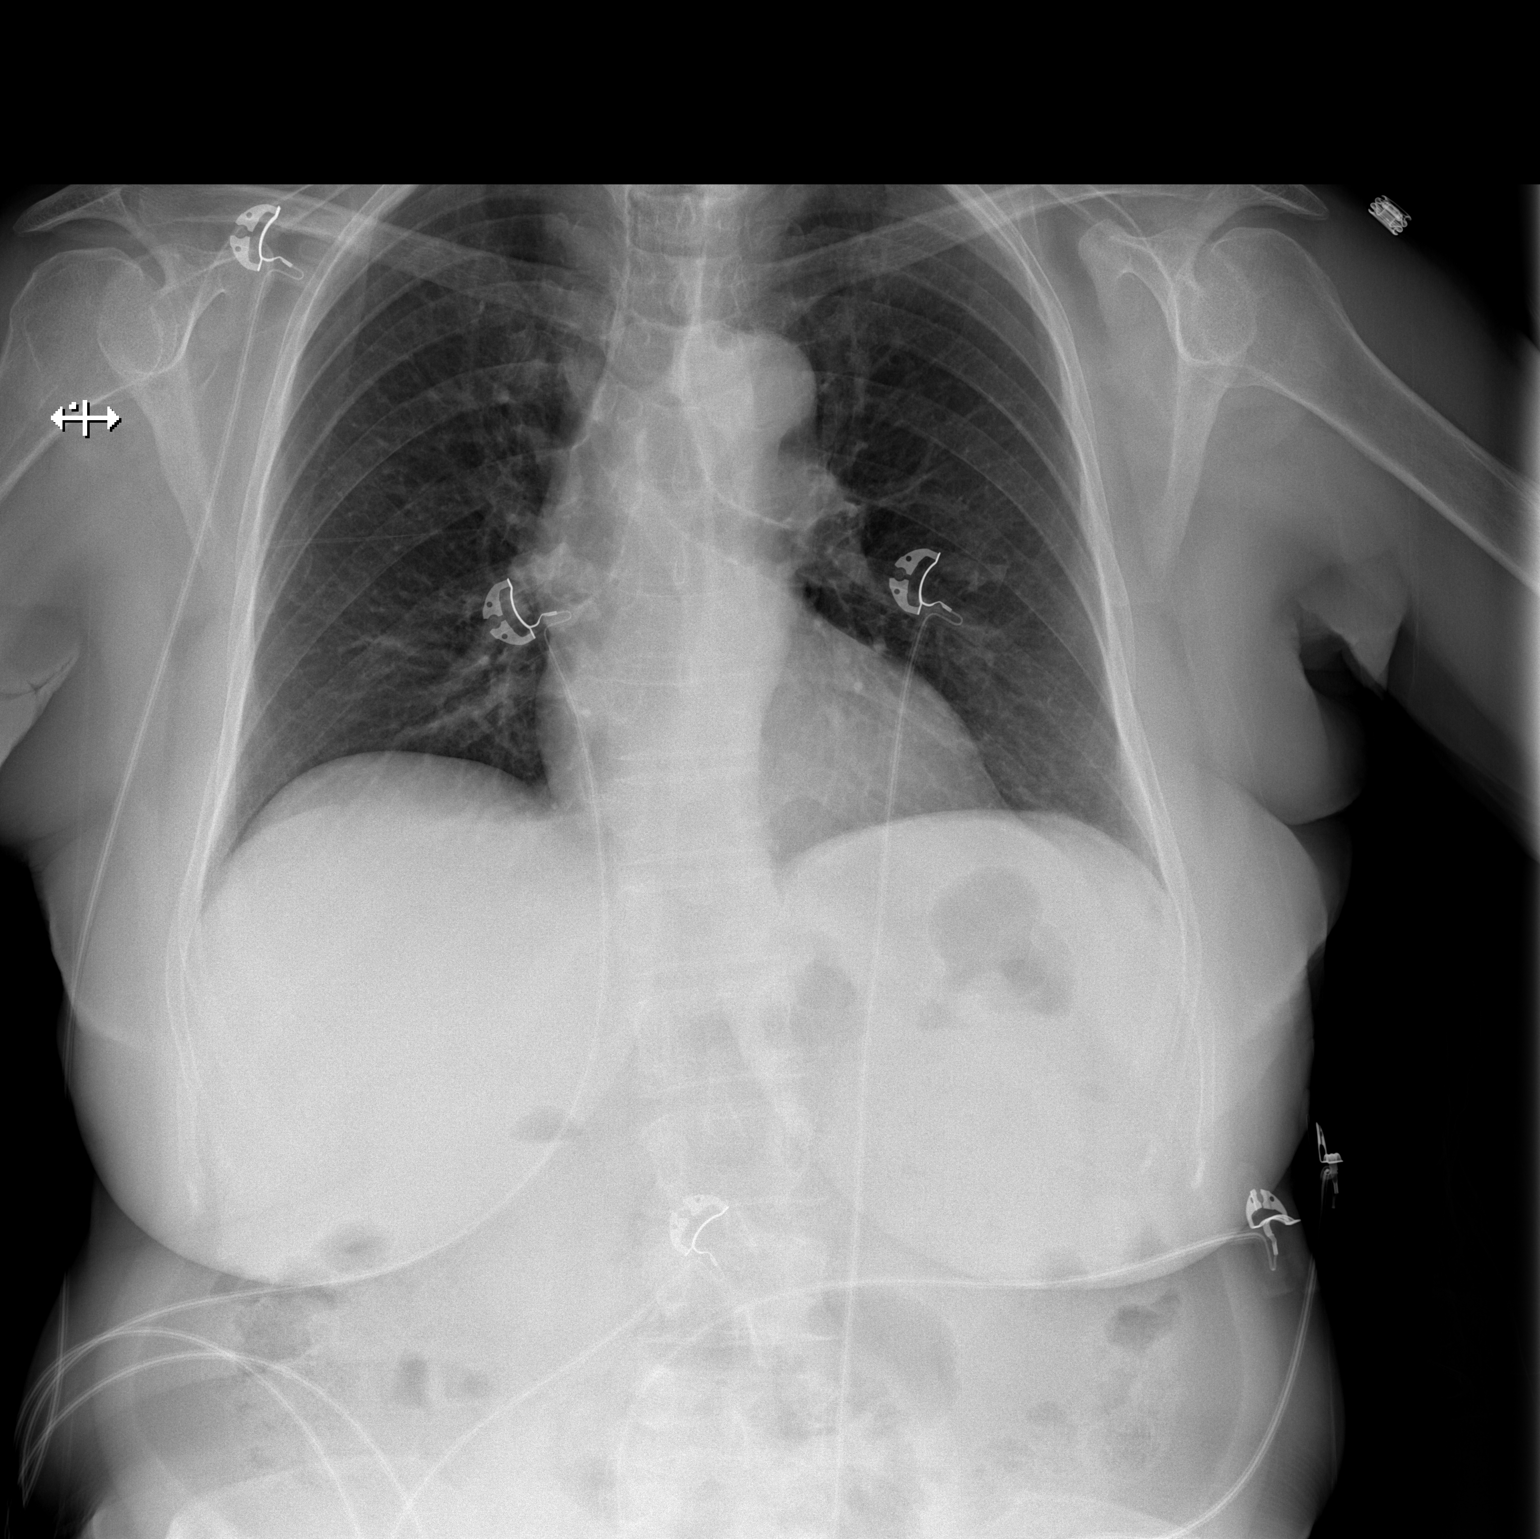

[w chest lat]
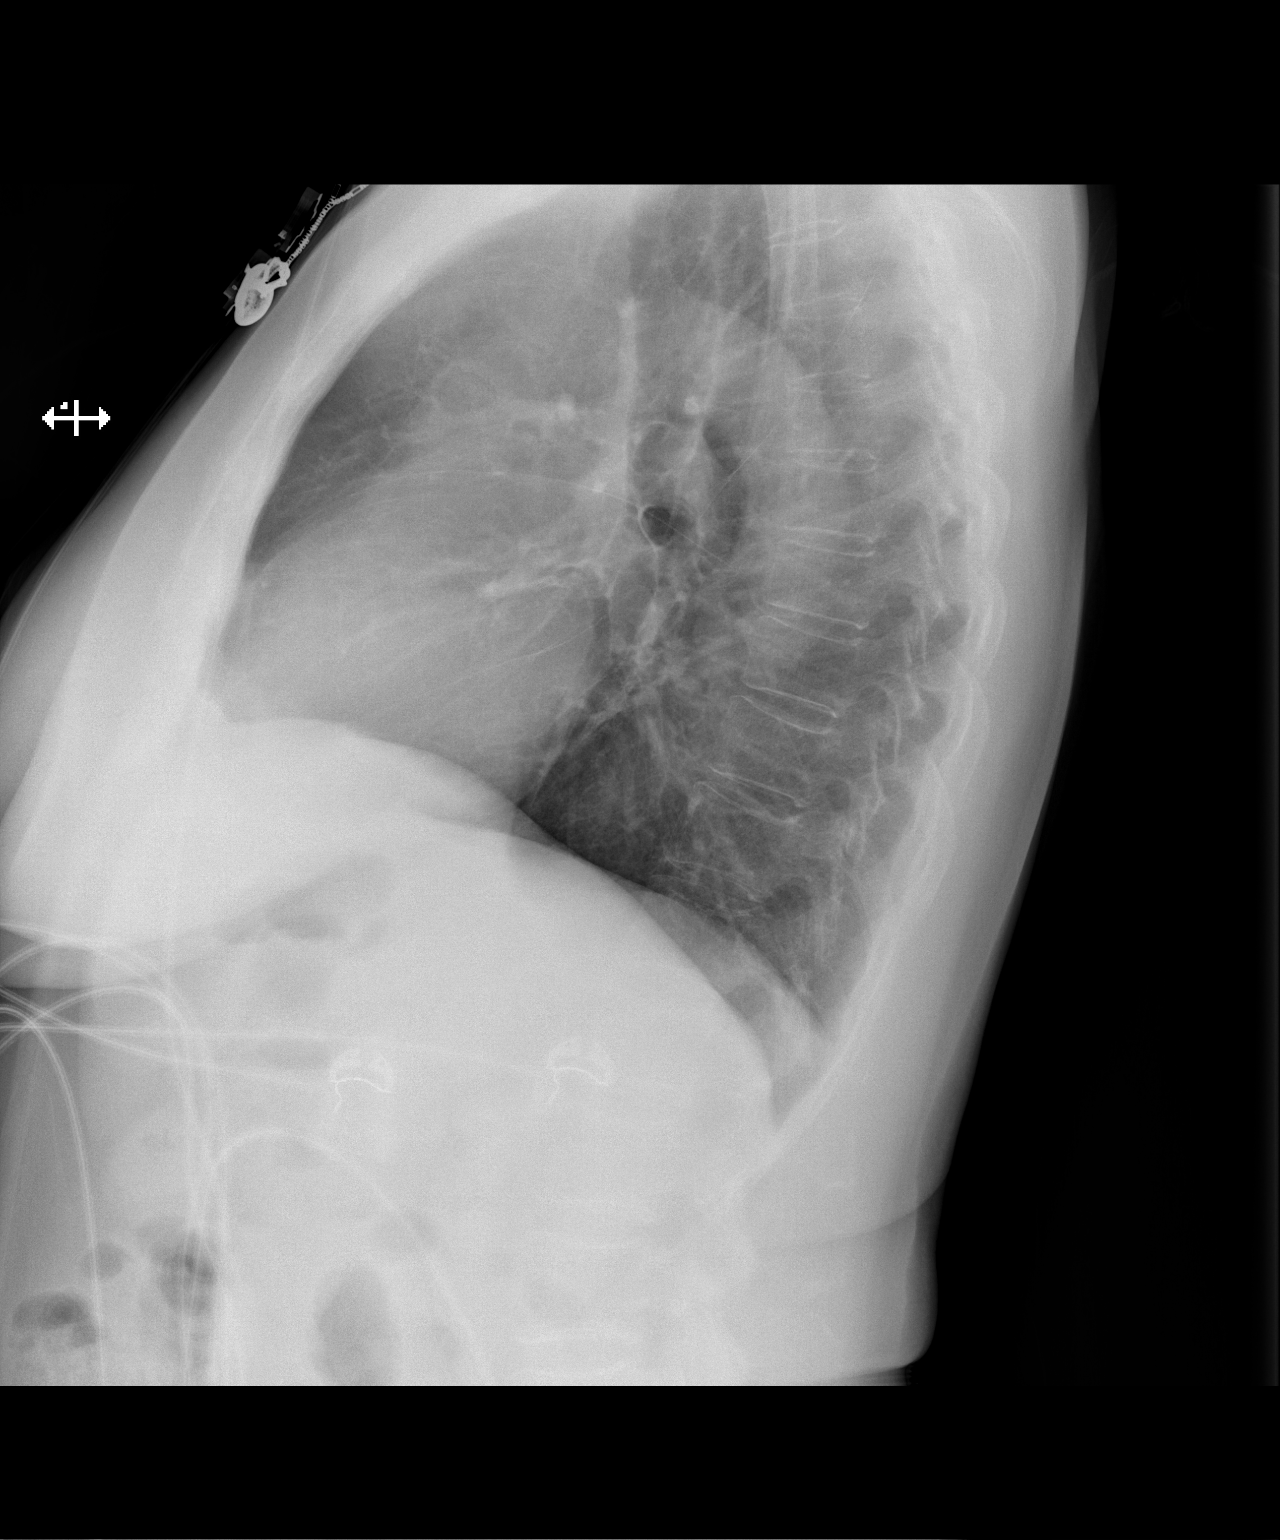

[2 of 2 positions shown; findings below may reference images not displayed]

FINDINGS: Heart size is normal. Mild aortic tortuosity. The lungs are clear.
The vascularity is normal. No effusions. Mild spinal curvature.
IMPRESSION: No active cardiopulmonary disease.

## 2018-08-10 ENCOUNTER — Other Ambulatory Visit: Payer: Self-pay | Admitting: Family Medicine

## 2018-08-10 MED ORDER — GLUCOSE BLOOD VI STRP: ORAL_STRIP | 12 refills | Status: DC

## 2018-08-10 NOTE — Telephone Encounter (Signed)
Copied from [place] [id number]. Topic: Quick Communication - Rx Refill/Question >> Aug 10, 2018  1:55 PM Reyne Dumas L wrote: Medication: glucose blood test strip  Has the patient contacted their pharmacy? Yes - states no response from [place] (Agent: If no, request that the patient contact the pharmacy for the refill.) (Agent: If yes, when and what did the pharmacy advise?)  Preferred Pharmacy (with phone number or street name): [id number] [address], [place]. [phone] (Phone) [phone] (Fax)  Agent: Please be advised that RX refills may take up to 3 business days. We ask that you follow-up with your pharmacy.

## 2018-08-10 NOTE — Telephone Encounter (Signed)
Requested Prescriptions  Pending Prescriptions Disp Refills  . glucose blood test strip 100 each 12    Sig: E11.9. Test blood glucose three times a day. Use as instructed     Endocrinology: Diabetes - Testing Supplies Passed - 08/10/2018  1:58 PM      Passed - Valid encounter within last 12 months    Recent Outpatient Visits          6 months ago Type 2 diabetes mellitus without complication, without long-term current use of insulin ([place])   Primary Care at [facility], Arlie Solomons, MD   1 year ago Decreased hearing of left ear   Primary Care at [facility] A, MD   1 year ago Type 2 diabetes mellitus with complication, without long-term current use of insulin ([place])   Primary Care at [facility], Zoe A, MD   1 year ago Type 2 diabetes mellitus without complication, without long-term current use of insulin ([place])   Primary Care at [facility], Zoe A, MD   1 year ago Type 2 diabetes mellitus without complication, without long-term current use of insulin [facility])   Primary Care at [facility], Arlie Solomons, MD

## 2018-08-14 ENCOUNTER — Other Ambulatory Visit: Payer: Self-pay | Admitting: Family Medicine

## 2018-08-14 DIAGNOSIS — E118 Type 2 diabetes mellitus with unspecified complications: Secondary | ICD-10-CM

## 2018-09-27 ENCOUNTER — Ambulatory Visit: Payer: Self-pay | Admitting: Family Medicine

## 2018-10-13 ENCOUNTER — Other Ambulatory Visit: Payer: Self-pay

## 2018-10-13 ENCOUNTER — Ambulatory Visit (INDEPENDENT_AMBULATORY_CARE_PROVIDER_SITE_OTHER): Payer: Medicare Other | Admitting: Family Medicine

## 2018-10-13 VITALS — BP 116/70 | Ht 66.0 in | Wt 132.0 lb

## 2018-10-13 DIAGNOSIS — Z0000 Encounter for general adult medical examination without abnormal findings: Secondary | ICD-10-CM

## 2018-10-13 NOTE — Progress Notes (Signed)
Presents today for [employer] Visit   Date of last exam: 01/26/2018  Interpreter used for this visit? NO  Patient Care Team: Forrest Moron, MD as PCP - General (Internal Medicine) Rutherford Guys, MD as Consulting Physician (Ophthalmology)   Other items to address today:   Discussed booking a follow up for diabetes check. Discussed eye/dental  Exams yearly Discussed Shingrix TDAP declined due to insurance.   Other Screening: Last screening for diabetes:01/26/2018 Last lipid screening: 01/26/2018  ADVANCE DIRECTIVES: Discussed: YES  On File: NO Materials Provided: YES (MAILED)  Immunization status:  Immunization History  Administered Date(s) Administered  . Influenza,inj,Quad PF,6+ Mos 02/20/2017  . Pneumococcal Conjugate-13 06/05/2014  . Pneumococcal Polysaccharide-23 04/12/2007     Health Maintenance Due  Topic Date Due  . HEMOGLOBIN A1C  07/29/2018     Functional Status Survey: Is the patient deaf or have difficulty hearing?: Yes(has had hearing test said all come back normal) Does the patient have difficulty seeing, even when wearing glasses/contacts?: No Does the patient have difficulty concentrating, remembering, or making decisions?: No Does the patient have difficulty walking or climbing stairs?: No Does the patient have difficulty dressing or bathing?: No Does the patient have difficulty doing errands alone such as visiting a doctor's office or shopping?: No   6CIT Screen 10/13/2018 07/27/2017  What Year? 0 points 0 points  What month? 0 points 0 points  What time? 0 points 0 points  Count back from 20 0 points 0 points  Months in reverse 0 points 0 points  Repeat phrase 0 points 8 points  Total Score 0 8         Home Environment:   One story home  Lives alone  No trouble climbing stairs No grab bars No scattered rugs.  No exercise regimen   Patient Active Problem List   Diagnosis Date Noted  . Pseudophakia  11/04/2016  . Type 2 diabetes mellitus without complication, without long-term current use of insulin ([place]) 10/22/2016  . Urticaria 10/22/2016     Past Medical History:  Diagnosis Date  . Diabetes mellitus without complication ([place])   . High cholesterol      Past Surgical History:  Procedure Laterality Date  . ABDOMINAL HYSTERECTOMY       Family History  Problem Relation Age of Onset  . Diabetes Mother   . Diabetes Sister   . Breast cancer Neg Hx      Social History   Socioeconomic History  . Marital status: Legally Separated    Spouse name: Not on file  . Number of children: 1  . Years of education: Not on file  . Highest education level: Master's degree (e.g., MA, MS, MEng, MEd, MSW, MBA)  Occupational History  . Not on file  Social Needs  . Financial resource strain: Not hard at all  . Food insecurity:    Worry: Never true    Inability: Never true  . Transportation needs:    Medical: No    Non-medical: No  Tobacco Use  . Smoking status: Never Smoker  . Smokeless tobacco: Never Used  Substance and Sexual Activity  . Alcohol use: Yes    Comment: red wine occasional   . Drug use: No  . Sexual activity: Never  Lifestyle  . Physical activity:    Days per week: 3 days    Minutes per session: 60 min  . Stress: Not at all  Relationships  . Social connections:  Talks on phone: More than three times a week    Gets together: More than three times a week    Attends religious service: More than 4 times per year    Active member of club or organization: No    Attends meetings of clubs or organizations: Never    Relationship status: Separated  . Intimate partner violence:    Fear of current or ex partner: No    Emotionally abused: No    Physically abused: No    Forced sexual activity: No  Other Topics Concern  . Not on file  Social History Narrative  . Not on file     No Known Allergies   Prior to Admission medications   Medication Sig Start  Date End Date Taking? Authorizing Provider  fluticasone (FLONASE) 50 MCG/ACT nasal spray Place 2 sprays into both nostrils daily. 07/27/17  Yes Stallings, Zoe A, MD  glucose blood test strip E11.9. Test blood glucose three times a day. Use as instructed 08/10/18  Yes Stallings, Zoe A, MD  metFORMIN (GLUCOPHAGE) 1000 MG tablet TAKE 1 TABLET (1,000 MG TOTAL) BY MOUTH 2 (TWO) TIMES DAILY WITH A MEAL. 08/16/18  Yes Delia Chimes A, MD  rosuvastatin (CRESTOR) 10 MG tablet Take 1 tablet (10 mg total) by mouth every other day. 01/26/18  Yes Delia Chimes A, MD  aspirin EC 81 MG tablet Take 324 mg by mouth once.    [provider]  tetrahydrozoline 0.05 % ophthalmic solution Place 1 drop into both eyes as needed. For dry eyes.    [provider]     Depression screen [facility] 2/9 10/13/2018 01/26/2018 07/27/2017 07/27/2017 06/11/2017  Decreased Interest 0 0 0 0 0  Down, Depressed, Hopeless 0 0 0 0 0  PHQ - 2 Score 0 0 0 0 0     Fall Risk  10/13/2018 01/26/2018 07/27/2017 07/27/2017 06/11/2017  Falls in the past year? 0 No No No No  Number falls in past yr: 0 - - - -  Injury with Fall? 0 - - - -      PHYSICAL EXAM: BP 116/70   Ht 5\' 6"  (1.676 m)   Wt 132 lb (59.9 kg)   BMI 21.31 kg/m    Wt Readings from Last 3 Encounters:  10/13/18 132 lb (59.9 kg)  01/26/18 132 lb 9.6 oz (60.1 kg)  07/27/17 131 lb 3.2 oz (59.5 kg)     No exam data present    Physical Exam   Education/Counseling provided regarding diet and exercise, prevention of chronic diseases, smoking/tobacco cessation, if applicable, and reviewed "Covered Medicare Preventive Services."   ASSESSMENT/PLAN: There are no diagnoses linked to this encounter.      Review of Systems:  N/A Cardiac Risk Factors include: advanced age (>78men, >58 women);diabetes mellitus;dyslipidemia

## 2018-10-13 NOTE — Patient Instructions (Signed)
Thank you for taking time to come for your Medicare Wellness Visit. I appreciate your ongoing commitment to your health goals. Please review the following plan we discussed and let me know if I can assist you in the future.  Leroy Kennedy LPN   Diabetes Mellitus and Nutrition, Adult When you have diabetes (diabetes mellitus), it is very important to have healthy eating habits because your blood sugar (glucose) levels are greatly affected by what you eat and drink. Eating healthy foods in the appropriate amounts, at about the same times every day, can help you:  Control your blood glucose.  Lower your risk of heart disease.  Improve your blood pressure.  Reach or maintain a healthy weight. Every person with diabetes is different, and each person has different needs for a meal plan. Your health care provider may recommend that you work with a diet and nutrition specialist (dietitian) to make a meal plan that is best for you. Your meal plan may vary depending on factors such as:  The calories you need.  The medicines you take.  Your weight.  Your blood glucose, blood pressure, and cholesterol levels.  Your activity level.  Other health conditions you have, such as heart or kidney disease. How do carbohydrates affect me? Carbohydrates, also called carbs, affect your blood glucose level more than any other type of food. Eating carbs naturally raises the amount of glucose in your blood. Carb counting is a method for keeping track of how many carbs you eat. Counting carbs is important to keep your blood glucose at a healthy level, especially if you use insulin or take certain oral diabetes medicines. It is important to know how many carbs you can safely have in each meal. This is different for every person. Your dietitian can help you calculate how many carbs you should have at each meal and for each snack. Foods that contain carbs include:  Bread, cereal, rice, pasta, and crackers.   Potatoes and corn.  Peas, beans, and lentils.  Milk and yogurt.  Fruit and juice.  Desserts, such as cakes, cookies, ice cream, and candy. How does alcohol affect me? Alcohol can cause a sudden decrease in blood glucose (hypoglycemia), especially if you use insulin or take certain oral diabetes medicines. Hypoglycemia can be a life-threatening condition. Symptoms of hypoglycemia (sleepiness, dizziness, and confusion) are similar to symptoms of having too much alcohol. If your health care provider says that alcohol is safe for you, follow these guidelines:  Limit alcohol intake to no more than 1 drink per day for nonpregnant women and 2 drinks per day for men. One drink equals 12 oz of beer, 5 oz of wine, or 1 oz of hard liquor.  Do not drink on an empty stomach.  Keep yourself hydrated with water, diet soda, or unsweetened iced tea.  Keep in mind that regular soda, juice, and other mixers may contain a lot of sugar and must be counted as carbs. What are tips for following this plan?  Reading food labels  Start by checking the serving size on the "Nutrition Facts" label of packaged foods and drinks. The amount of calories, carbs, fats, and other nutrients listed on the label is based on one serving of the item. Many items contain more than one serving per package.  Check the total grams (g) of carbs in one serving. You can calculate the number of servings of carbs in one serving by dividing the total carbs by 15. For example, if a food  has 30 g of total carbs, it would be equal to 2 servings of carbs.  Check the number of grams (g) of saturated and trans fats in one serving. Choose foods that have low or no amount of these fats.  Check the number of milligrams (mg) of salt (sodium) in one serving. Most people should limit total sodium intake to less than 2,300 mg per day.  Always check the nutrition information of foods labeled as "low-fat" or "nonfat". These foods may be higher in  added sugar or refined carbs and should be avoided.  Talk to your dietitian to identify your daily goals for nutrients listed on the label. Shopping  Avoid buying canned, premade, or processed foods. These foods tend to be high in fat, sodium, and added sugar.  Shop around the outside edge of the grocery store. This includes fresh fruits and vegetables, bulk grains, fresh meats, and fresh dairy. Cooking  Use low-heat cooking methods, such as baking, instead of high-heat cooking methods like deep frying.  Cook using healthy oils, such as olive, canola, or sunflower oil.  Avoid cooking with butter, cream, or high-fat meats. Meal planning  Eat meals and snacks regularly, preferably at the same times every day. Avoid going long periods of time without eating.  Eat foods high in fiber, such as fresh fruits, vegetables, beans, and whole grains. Talk to your dietitian about how many servings of carbs you can eat at each meal.  Eat 4-6 ounces (oz) of lean protein each day, such as lean meat, chicken, fish, eggs, or tofu. One oz of lean protein is equal to: ? 1 oz of meat, chicken, or fish. ? 1 egg. ?  cup of tofu.  Eat some foods each day that contain healthy fats, such as avocado, nuts, seeds, and fish. Lifestyle  Check your blood glucose regularly.  Exercise regularly as told by your health care provider. This may include: ? 150 minutes of moderate-intensity or vigorous-intensity exercise each week. This could be brisk walking, biking, or water aerobics. ? Stretching and doing strength exercises, such as yoga or weightlifting, at least 2 times a week.  Take medicines as told by your health care provider.  Do not use any products that contain nicotine or tobacco, such as cigarettes and e-cigarettes. If you need help quitting, ask your health care provider.  Work with a Social worker or diabetes educator to identify strategies to manage stress and any emotional and social challenges.  Questions to ask a health care provider  Do I need to meet with a diabetes educator?  Do I need to meet with a dietitian?  What number can I call if I have questions?  When are the best times to check my blood glucose? Where to find more information:  American Diabetes Association: diabetes.org  Academy of Nutrition and Dietetics: www.eatright.[employer] of Diabetes and Digestive and Kidney Diseases (NIH): [employer] Summary  A healthy meal plan will help you control your blood glucose and maintain a healthy lifestyle.  Working with a diet and nutrition specialist (dietitian) can help you make a meal plan that is best for you.  Keep in mind that carbohydrates (carbs) and alcohol have immediate effects on your blood glucose levels. It is important to count carbs and to use alcohol carefully. This information is not intended to replace advice given to you by your health care provider. Make sure you discuss any questions you have with your health care provider. Document Released: 03/06/2005 Document Revised: 01/07/2017  Document Reviewed: 07/14/2016 Elsevier Interactive Patient Education  2019 [place] Directive  Advance directives are legal documents that let you make choices ahead of time about your health care and medical treatment in case you become unable to communicate for yourself. Advance directives are a way for you to communicate your wishes to family, friends, and health care providers. This can help convey your decisions about end-of-life care if you become unable to communicate. Discussing and writing advance directives should happen over time rather than all at once. Advance directives can be changed depending on your situation and what you want, even after you have signed the advance directives. If you do not have an advance directive, some states assign family decision makers to act on your behalf based on how closely you are related to  them. Each state has its own laws regarding advance directives. You may want to check with your health care provider, attorney, or state representative about the laws in your state. There are different types of advance directives, such as:  Medical power of attorney.  Living will.  Do not resuscitate (DNR) or do not attempt resuscitation (DNAR) order. Health care proxy and medical power of attorney A health care proxy, also called a health care agent, is a person who is appointed to make medical decisions for you in cases in which you are unable to make the decisions yourself. Generally, people choose someone they know well and trust to represent their preferences. Make sure to ask this person for an agreement to act as your proxy. A proxy may have to exercise judgment in the event of a medical decision for which your wishes are not known. A medical power of attorney is a legal document that names your health care proxy. Depending on the laws in your state, after the document is written, it may also need to be:  Signed.  Notarized.  Dated.  Copied.  Witnessed.  Incorporated into your medical record. You may also want to appoint someone to manage your financial affairs in a situation in which you are unable to do so. This is called a durable power of attorney for finances. It is a separate legal document from the durable power of attorney for health care. You may choose the same person or someone different from your health care proxy to act as your agent in financial matters. If you do not appoint a proxy, or if there is a concern that the proxy is not acting in your best interests, a court-appointed guardian may be designated to act on your behalf. Living will A living will is a set of instructions documenting your wishes about medical care when you cannot express them yourself. Health care providers should keep a copy of your living will in your medical record. You may want to give a copy  to family members or friends. To alert caregivers in case of an emergency, you can place a card in your wallet to let them know that you have a living will and where they can find it. A living will is used if you become:  Terminally ill.  Incapacitated.  Unable to communicate or make decisions. Items to consider in your living will include:  The use or non-use of life-sustaining equipment, such as dialysis machines and breathing machines (ventilators).  A DNR or DNAR order, which is the instruction not to use cardiopulmonary resuscitation (CPR) if breathing or heartbeat stops.  The use or non-use of tube feeding.  Withholding of food  and fluids.  Comfort (palliative) care when the goal becomes comfort rather than a cure.  Organ and tissue donation. A living will does not give instructions for distributing your money and property if you should pass away. It is recommended that you seek the advice of a lawyer when writing a will. Decisions about taxes, beneficiaries, and asset distribution will be legally binding. This process can relieve your family and friends of any concerns surrounding disputes or questions that may come up about the distribution of your assets. DNR or DNAR A DNR or DNAR order is a request not to have CPR in the event that your heart stops beating or you stop breathing. If a DNR or DNAR order has not been made and shared, a health care provider will try to help any patient whose heart has stopped or who has stopped breathing. If you plan to have surgery, talk with your health care provider about how your DNR or DNAR order will be followed if problems occur. Summary  Advance directives are the legal documents that allow you to make choices ahead of time about your health care and medical treatment in case you become unable to communicate for yourself.  The process of discussing and writing advance directives should happen over time. You can change the advance directives,  even after you have signed them.  Advance directives include DNR or DNAR orders, living wills, and designating an agent as your medical power of attorney. This information is not intended to replace advice given to you by your health care provider. Make sure you discuss any questions you have with your health care provider. Document Released: 09/16/2007 Document Revised: 04/28/2016 Document Reviewed: 04/28/2016 Elsevier Interactive Patient Education  2019 [employer].

## 2018-11-08 NOTE — Progress Notes (Addendum)
Presents today for [employer] Visit  I connected with  Megan Wolfe on 11/08/18 by a   Phone call enabled telemedicine application and verified that I am speaking with the correct person using two identifiers.    The patient expressed understanding and agreed to proceed.    Date of last exam: 01/26/2018  Interpreter used for this visit? NO  Patient Care Team: Forrest Moron, MD as PCP - General (Internal Medicine) Rutherford Guys, MD as Consulting Physician (Ophthalmology)   Other items to address today:   Discussed booking a follow up for diabetes check. Discussed eye/dental  Exams yearly Discussed Shingrix TDAP declined due to insurance.   Other Screening: Last screening for diabetes:01/26/2018 Last lipid screening: 01/26/2018  ADVANCE DIRECTIVES: Discussed: YES  On File: NO Materials Provided: YES (MAILED)  Immunization status:  Immunization History  Administered Date(s) Administered  . Influenza,inj,Quad PF,6+ Mos 02/20/2017  . Pneumococcal Conjugate-13 06/05/2014  . Pneumococcal Polysaccharide-23 04/12/2007     Health Maintenance Due  Topic Date Due  . HEMOGLOBIN A1C  07/29/2018  . OPHTHALMOLOGY EXAM  11/06/2018     Functional Status Survey: Is the patient deaf or have difficulty hearing?: Yes(has had hearing test said all come back normal) Does the patient have difficulty seeing, even when wearing glasses/contacts?: No Does the patient have difficulty concentrating, remembering, or making decisions?: No Does the patient have difficulty walking or climbing stairs?: No Does the patient have difficulty dressing or bathing?: No Does the patient have difficulty doing errands alone such as visiting a doctor's office or shopping?: No   6CIT Screen 10/13/2018 07/27/2017  What Year? 0 points 0 points  What month? 0 points 0 points  What time? 0 points 0 points  Count back from 20 0 points 0 points  Months in reverse 0 points 0 points   Repeat phrase 0 points 8 points  Total Score 0 8        Office Visit from 10/13/2018 in [facility] [place]  AUDIT-C Score  0       Home Environment:   One story home  Lives alone  No trouble climbing stairs No grab bars No scattered rugs.  No exercise regimen   Patient Active Problem List   Diagnosis Date Noted  . Pseudophakia 11/04/2016  . Type 2 diabetes mellitus without complication, without long-term current use of insulin ([place]) 10/22/2016  . Urticaria 10/22/2016     Past Medical History:  Diagnosis Date  . Diabetes mellitus without complication ([place])   . High cholesterol      Past Surgical History:  Procedure Laterality Date  . ABDOMINAL HYSTERECTOMY       Family History  Problem Relation Age of Onset  . Diabetes Mother   . Diabetes Sister   . Breast cancer Neg Hx      Social History   Socioeconomic History  . Marital status: Legally Separated    Spouse name: Not on file  . Number of children: 1  . Years of education: Not on file  . Highest education level: Master's degree (e.g., MA, MS, MEng, MEd, MSW, MBA)  Occupational History  . Not on file  Social Needs  . Financial resource strain: Not hard at all  . Food insecurity:    Worry: Never true    Inability: Never true  . Transportation needs:    Medical: No    Non-medical: No  Tobacco Use  . Smoking status: Never Smoker  .  Smokeless tobacco: Never Used  Substance and Sexual Activity  . Alcohol use: Yes    Comment: red wine occasional   . Drug use: No  . Sexual activity: Never  Lifestyle  . Physical activity:    Days per week: 3 days    Minutes per session: 60 min  . Stress: Not at all  Relationships  . Social connections:    Talks on phone: More than three times a week    Gets together: More than three times a week    Attends religious service: More than 4 times per year    Active member of club or organization: No    Attends meetings of clubs or organizations: Never     Relationship status: Separated  . Intimate partner violence:    Fear of current or ex partner: No    Emotionally abused: No    Physically abused: No    Forced sexual activity: No  Other Topics Concern  . Not on file  Social History Narrative  . Not on file     No Known Allergies   Prior to Admission medications   Medication Sig Start Date End Date Taking? Authorizing Provider  fluticasone (FLONASE) 50 MCG/ACT nasal spray Place 2 sprays into both nostrils daily. 07/27/17  Yes Stallings, Zoe A, MD  glucose blood test strip E11.9. Test blood glucose three times a day. Use as instructed 08/10/18  Yes Stallings, Zoe A, MD  metFORMIN (GLUCOPHAGE) 1000 MG tablet TAKE 1 TABLET (1,000 MG TOTAL) BY MOUTH 2 (TWO) TIMES DAILY WITH A MEAL. 08/16/18  Yes Delia Chimes A, MD  rosuvastatin (CRESTOR) 10 MG tablet Take 1 tablet (10 mg total) by mouth every other day. 01/26/18  Yes Delia Chimes A, MD  aspirin EC 81 MG tablet Take 324 mg by mouth once.    [provider]  tetrahydrozoline 0.05 % ophthalmic solution Place 1 drop into both eyes as needed. For dry eyes.    [provider]     Depression screen [facility] 2/9 10/13/2018 01/26/2018 07/27/2017 07/27/2017 06/11/2017  Decreased Interest 0 0 0 0 0  Down, Depressed, Hopeless 0 0 0 0 0  PHQ - 2 Score 0 0 0 0 0     Fall Risk  10/13/2018 01/26/2018 07/27/2017 07/27/2017 06/11/2017  Falls in the past year? 0 No No No No  Number falls in past yr: 0 - - - -  Injury with Fall? 0 - - - -      PHYSICAL EXAM: BP 116/70   Ht 5\' 6"  (1.676 m)   Wt 132 lb (59.9 kg)   BMI 21.31 kg/m    Wt Readings from Last 3 Encounters:  10/13/18 132 lb (59.9 kg)  01/26/18 132 lb 9.6 oz (60.1 kg)  07/27/17 131 lb 3.2 oz (59.5 kg)     No exam data present    Physical Exam   Education/Counseling provided regarding diet and exercise, prevention of chronic diseases, smoking/tobacco cessation, if applicable, and reviewed "Covered Medicare Preventive  Services."   ASSESSMENT/PLAN: There are no diagnoses linked to this encounter.      Review of Systems:  N/A Cardiac Risk Factors include: advanced age (>44men, >41 women);diabetes mellitus;dyslipidemia

## 2018-11-16 ENCOUNTER — Other Ambulatory Visit: Payer: Self-pay | Admitting: Family Medicine

## 2018-11-16 DIAGNOSIS — E118 Type 2 diabetes mellitus with unspecified complications: Secondary | ICD-10-CM

## 2019-02-07 ENCOUNTER — Other Ambulatory Visit: Payer: Self-pay | Admitting: Family Medicine

## 2019-02-07 DIAGNOSIS — E785 Hyperlipidemia, unspecified: Secondary | ICD-10-CM

## 2019-02-07 MED ORDER — ROSUVASTATIN CALCIUM 10 MG PO TABS: 10.0000 mg | ORAL_TABLET | ORAL | 0 refills | Status: DC

## 2019-02-07 NOTE — Telephone Encounter (Signed)
Medication Refill - Medication: rosuvastatin (CRESTOR) 10 MG tablet   Has the patient contacted their pharmacy? No. (Agent: If no, request that the patient contact the pharmacy for the refill.) (Agent: If yes, when and what did the pharmacy advise?)  Preferred Pharmacy (with phone number or street name):  [id number] [address], [place]. [phone] (Phone) [phone] (Fax)     Agent: Please be advised that RX refills may take up to 3 business days. We ask that you follow-up with your pharmacy.

## 2019-03-13 ENCOUNTER — Other Ambulatory Visit: Payer: Self-pay | Admitting: Family Medicine

## 2019-03-13 DIAGNOSIS — E118 Type 2 diabetes mellitus with unspecified complications: Secondary | ICD-10-CM

## 2019-03-18 ENCOUNTER — Other Ambulatory Visit: Payer: Self-pay

## 2019-03-18 ENCOUNTER — Encounter: Payer: Self-pay | Admitting: Family Medicine

## 2019-03-18 ENCOUNTER — Ambulatory Visit (INDEPENDENT_AMBULATORY_CARE_PROVIDER_SITE_OTHER): Payer: Medicare Other | Admitting: Family Medicine

## 2019-03-18 VITALS — BP 146/74 | HR 73 | Temp 98.4°F | Ht 64.0 in | Wt 130.8 lb

## 2019-03-18 DIAGNOSIS — R350 Frequency of micturition: Secondary | ICD-10-CM

## 2019-03-18 DIAGNOSIS — E785 Hyperlipidemia, unspecified: Secondary | ICD-10-CM | POA: Diagnosis not present

## 2019-03-18 DIAGNOSIS — Z23 Encounter for immunization: Secondary | ICD-10-CM | POA: Diagnosis not present

## 2019-03-18 DIAGNOSIS — E118 Type 2 diabetes mellitus with unspecified complications: Secondary | ICD-10-CM | POA: Diagnosis not present

## 2019-03-18 DIAGNOSIS — E1165 Type 2 diabetes mellitus with hyperglycemia: Secondary | ICD-10-CM

## 2019-03-18 DIAGNOSIS — L659 Nonscarring hair loss, unspecified: Secondary | ICD-10-CM | POA: Diagnosis not present

## 2019-03-18 LAB — CBC
Hematocrit: 37.7 % (ref 34.0–46.6)
Hemoglobin: 13 g/dL (ref 11.1–15.9)
MCH: 31 pg (ref 26.6–33.0)
MCHC: 34.5 g/dL (ref 31.5–35.7)
MCV: 90 fL (ref 79–97)
Platelets: 286 10*3/uL (ref 150–450)
RBC: 4.2 x10E6/uL (ref 3.77–5.28)
RDW: 13.8 % (ref 11.7–15.4)
WBC: 4 10*3/uL (ref 3.4–10.8)

## 2019-03-18 LAB — POCT URINALYSIS DIP (MANUAL ENTRY)
Bilirubin, UA: NEGATIVE
Blood, UA: NEGATIVE
Glucose, UA: NEGATIVE mg/dL
Leukocytes, UA: NEGATIVE
Nitrite, UA: NEGATIVE
Protein Ur, POC: NEGATIVE mg/dL
Spec Grav, UA: 1.025 (ref 1.010–1.025)
Urobilinogen, UA: 0.2 E.U./dL
pH, UA: 5 (ref 5.0–8.0)

## 2019-03-18 LAB — CMP14+EGFR
ALT: 16 IU/L (ref 0–32)
AST: 18 IU/L (ref 0–40)
Albumin/Globulin Ratio: 1.6 (ref 1.2–2.2)
Albumin: 4.4 g/dL (ref 3.6–4.6)
Alkaline Phosphatase: 70 IU/L (ref 39–117)
BUN/Creatinine Ratio: 14 (ref 12–28)
BUN: 10 mg/dL (ref 8–27)
Bilirubin Total: 0.2 mg/dL (ref 0.0–1.2)
CO2: 19 mmol/L — ABNORMAL LOW (ref 20–29)
Calcium: 9.8 mg/dL (ref 8.7–10.3)
Chloride: 105 mmol/L (ref 96–106)
Creatinine, Ser: 0.74 mg/dL (ref 0.57–1.00)
GFR calc Af Amer: 88 mL/min/{1.73_m2} (ref >59)
GFR calc non Af Amer: 76 mL/min/{1.73_m2} (ref >59)
Globulin, Total: 2.8 g/dL (ref 1.5–4.5)
Glucose: 150 mg/dL — ABNORMAL HIGH (ref 65–99)
Potassium: 4.5 mmol/L (ref 3.5–5.2)
Sodium: 140 mmol/L (ref 134–144)
Total Protein: 7.2 g/dL (ref 6.0–8.5)

## 2019-03-18 LAB — LIPID PANEL
Chol/HDL Ratio: 4 ratio (ref 0.0–4.4)
Cholesterol, Total: 178 mg/dL (ref 100–199)
HDL: 44 mg/dL (ref >39)
LDL Chol Calc (NIH): 114 mg/dL — ABNORMAL HIGH (ref 0–99)
Triglycerides: 110 mg/dL (ref 0–149)
VLDL Cholesterol Cal: 20 mg/dL (ref 5–40)

## 2019-03-18 LAB — HEMOGLOBIN A1C
Est. average glucose Bld gHb Est-mCnc: 183 mg/dL
Hgb A1c MFr Bld: 8 % — ABNORMAL HIGH (ref 4.8–5.6)

## 2019-03-18 MED ORDER — ROSUVASTATIN CALCIUM 10 MG PO TABS: 10.0000 mg | ORAL_TABLET | ORAL | 3 refills | Status: DC

## 2019-03-18 MED ORDER — METFORMIN HCL 1000 MG PO TABS: 1000.0000 mg | ORAL_TABLET | Freq: Two times a day (BID) | ORAL | 1 refills | Status: DC

## 2019-03-18 NOTE — Patient Instructions (Addendum)
  Zyrtec - take at bedtime for night time itching   If you have lab work done today you will be contacted with your lab results within the next 2 weeks.  If you have not heard from [place] then please contact us. The fastest way to get your results is to register for My Chart.   IF you received an x-ray today, you will receive an invoice from [facility]. Please contact [facility] at [phone] with questions or concerns regarding your invoice.   IF you received labwork today, you will receive an invoice from [facility]. Please contact [facility] at [phone] with questions or concerns regarding your invoice.   Our billing staff will not be able to assist you with questions regarding bills from these companies.  You will be contacted with the lab results as soon as they are available. The fastest way to get your results is to activate your My Chart account. Instructions are located on the last page of this paperwork. If you have not heard from [place] regarding the results in 2 weeks, please contact this office.

## 2019-03-18 NOTE — Progress Notes (Signed)
Established Patient Office Visit  Subjective:  Patient ID: Megan Wolfe, female    DOB: [birth year]  Age: 83 y.o. MRN: [id number]  CC:  Chief Complaint  Patient presents with  . Diabetes    f/u     HPI Megan Wolfe presents for   Diabetes Mellitus: Patient presents for follow up of diabetes. Symptoms: polyuria. Symptoms have stabilized. Patient denies hypoglycemia , increase appetite, nausea, paresthesia of the feet and polydipsia.  Evaluation to date has been included: hemoglobin A1C.  Home sugars: BGs consistently in an acceptable range. Treatment to date: Continued metformin which has been effective.  Lab Results  Component Value Date   HGBA1C 8.3 (H) 01/26/2018   She is on Crestor, metformin 165m bid and aspirin 89mdaily  Hypertension: Patient here for follow-up of elevated blood pressure. She is exercising and is adherent to low salt diet.  Blood pressure is well controlled at home. Cardiac symptoms none. Patient denies chest pain, claudication, dyspnea, fatigue, near-syncope and palpitations.  Cardiovascular risk factors: diabetes mellitus, dyslipidemia and hypertension. Use of agents associated with hypertension: none. History of target organ damage: none. BP Readings from Last 3 Encounters:  03/18/19 (!) 146/74  10/13/18 116/70  01/26/18 116/70   Mixed Hyperlipidemia Dyslipidemia: Patient presents for evaluation of lipids.  Compliance with treatment thus far has been excellent.  A repeat fasting lipid profile was done.  The patient does not use medications that may worsen dyslipidemias (corticosteroids, progestins, anabolic steroids, diuretics, beta-blockers, amiodarone, cyclosporine, olanzapine). The patient exercises weekly.  The patient is known to have coexisting coronary artery disease.    Lab Results  Component Value Date   CHOL 223 (H) 01/26/2018   CHOL 175 07/27/2017   CHOL 227 (H) 10/22/2016   Lab Results  Component Value Date   HDL 47  01/26/2018   HDL 51 07/27/2017   HDL 54 10/22/2016   Lab Results  Component Value Date   LDLCALC 141 (H) 01/26/2018   LDLCALC 107 (H) 07/27/2017   LDLCALC 147 (H) 10/22/2016   Lab Results  Component Value Date   TRIG 173 (H) 01/26/2018   TRIG 83 07/27/2017   TRIG 129 10/22/2016   Lab Results  Component Value Date   CHOLHDL 4.7 (H) 01/26/2018   CHOLHDL 3.4 07/27/2017   CHOLHDL 4.2 10/22/2016   No results found for: LDLDIRECT   Past Medical History:  Diagnosis Date  . Diabetes mellitus without complication ([place]  . High cholesterol     Past Surgical History:  Procedure Laterality Date  . ABDOMINAL HYSTERECTOMY      Family History  Problem Relation Age of Onset  . Diabetes Mother   . Diabetes Sister   . Breast cancer Neg Hx     Social History   Socioeconomic History  . Marital status: Legally Separated    Spouse name: Not on file  . Number of children: 1  . Years of education: Not on file  . Highest education level: Master's degree (e.g., MA, MS, MEng, MEd, MSW, MBA)  Occupational History  . Not on file  Social Needs  . Financial resource strain: Not hard at all  . Food insecurity    Worry: Never true    Inability: Never true  . Transportation needs    Medical: No    Non-medical: No  Tobacco Use  . Smoking status: Never Smoker  . Smokeless tobacco: Never Used  Substance and Sexual Activity  . Alcohol use: Yes  Comment: red wine occasional   . Drug use: No  . Sexual activity: Never  Lifestyle  . Physical activity    Days per week: 3 days    Minutes per session: 60 min  . Stress: Not at all  Relationships  . Social connections    Talks on phone: More than three times a week    Gets together: More than three times a week    Attends religious service: More than 4 times per year    Active member of club or organization: No    Attends meetings of clubs or organizations: Never    Relationship status: Separated  . Intimate partner violence     Fear of current or ex partner: No    Emotionally abused: No    Physically abused: No    Forced sexual activity: No  Other Topics Concern  . Not on file  Social History Narrative  . Not on file    Outpatient Medications Prior to Visit  Medication Sig Dispense Refill  . aspirin EC 81 MG tablet Take 324 mg by mouth once.    . fluticasone (FLONASE) 50 MCG/ACT nasal spray Place 2 sprays into both nostrils daily. 16 g 6  . glucose blood test strip E11.9. Test blood glucose three times a day. Use as instructed 100 each 12  . tetrahydrozoline 0.05 % ophthalmic solution Place 1 drop into both eyes as needed. For dry eyes.    . metFORMIN (GLUCOPHAGE) 1000 MG tablet TAKE 1 TABLET BY MOUTH 2 TIMES DAILY WITH A MEAL. 180 tablet 0  . rosuvastatin (CRESTOR) 10 MG tablet Take 1 tablet (10 mg total) by mouth every other day. Please schedule an office visit 30 tablet 0   No facility-administered medications prior to visit.     No Known Allergies  ROS Review of Systems Review of Systems  Constitutional: Negative for activity change, appetite change, chills and fever.  HENT: Negative for congestion, nosebleeds, trouble swallowing and voice change.   Respiratory: Negative for cough, shortness of breath and wheezing.   Gastrointestinal: Negative for diarrhea, nausea and vomiting.  Genitourinary: Negative for difficulty urinating, dysuria, flank pain and hematuria.  Musculoskeletal: Negative for back pain, joint swelling and neck pain.  Neurological: Negative for dizziness, speech difficulty, light-headedness and numbness.  Skin: hair loss after taking her weave out as well as nighttime itching but no rash See HPI. All other review of systems negative.     Objective:    Physical Exam  BP (!) 146/74 (BP Location: Left Arm, Patient Position: Sitting, Cuff Size: Normal)   Pulse 73   Temp 98.4 F (36.9 C) (Oral)   Ht 5' 4" (1.626 m)   Wt 130 lb 12.8 oz (59.3 kg)   SpO2 96%   BMI 22.45 kg/m   Wt Readings from Last 3 Encounters:  03/18/19 130 lb 12.8 oz (59.3 kg)  10/13/18 132 lb (59.9 kg)  01/26/18 132 lb 9.6 oz (60.1 kg)   Physical Exam  Constitutional: Oriented to person, place, and time. Appears well-developed and well-nourished.  HENT:  Head: Normocephalic and atraumatic.  Eyes: Conjunctivae and EOM are normal.  Cardiovascular: Normal rate, regular rhythm, normal heart sounds and intact distal pulses.  No murmur heard. Pulmonary/Chest: Effort normal and breath sounds normal. No stridor. No respiratory distress. Has no wheezes.  Abdomen: nondistended, normoactive bs, soft, nontender, no flank tenderness  Neurological: Is alert and oriented to person, place, and time.  Skin: Skin is warm. Capillary refill takes  less than 2 seconds.  Psychiatric: Has a normal mood and affect. Behavior is normal. Judgment and thought content normal.    Health Maintenance Due  Topic Date Due  . DEXA SCAN  08/06/2002  . HEMOGLOBIN A1C  07/29/2018  . OPHTHALMOLOGY EXAM  11/06/2018  . URINE MICROALBUMIN  01/27/2019    There are no preventive care reminders to display for this patient.  No results found for: TSH Lab Results  Component Value Date   WBC 5.5 07/27/2017   HGB 12.6 07/27/2017   HCT 39.0 07/27/2017   MCV 92 07/27/2017   PLT 269 07/27/2017   Lab Results  Component Value Date   NA 140 01/26/2018   K 4.6 01/26/2018   CO2 19 (L) 01/26/2018   GLUCOSE 206 (H) 01/26/2018   BUN 11 01/26/2018   CREATININE 0.81 01/26/2018   BILITOT 0.2 01/26/2018   ALKPHOS 68 01/26/2018   AST 13 01/26/2018   ALT 13 01/26/2018   PROT 7.5 01/26/2018   ALBUMIN 4.4 01/26/2018   CALCIUM 9.2 01/26/2018   ANIONGAP 9 01/23/2017   Lab Results  Component Value Date   CHOL 223 (H) 01/26/2018   Lab Results  Component Value Date   HDL 47 01/26/2018   Lab Results  Component Value Date   LDLCALC 141 (H) 01/26/2018   Lab Results  Component Value Date   TRIG 173 (H) 01/26/2018   Lab  Results  Component Value Date   CHOLHDL 4.7 (H) 01/26/2018   Lab Results  Component Value Date   HGBA1C 8.3 (H) 01/26/2018      Assessment & Plan:   Problem List Items Addressed This Visit          Other Visit Diagnoses    Dyslipidemia    - will assess Tolerating crestor well    Relevant Medications   rosuvastatin (CRESTOR) 10 MG tablet   Other Relevant Orders   CMP14+EGFR   Lipid panel   Need for influenza vaccination       Relevant Orders   Flu Vaccine QUAD High Dose(Fluad) (Completed)   Increased urinary frequency    - no UTI or glucosuria noted    Relevant Orders   POCT urinalysis dipstick (Completed)   Type 2 diabetes mellitus with complication, without long-term current use of insulin (HCC)   Awaiting A1c but diet and med compliance are good     Relevant Medications   rosuvastatin (CRESTOR) 10 MG tablet   metFORMIN (GLUCOPHAGE) 1000 MG tablet   Hair loss    - advised to avoid weaves, drink more water and if needed will refer to Dermatology      Meds ordered this encounter  Medications  . rosuvastatin (CRESTOR) 10 MG tablet    Sig: Take 1 tablet (10 mg total) by mouth every other day.    Dispense:  90 tablet    Refill:  3  . metFORMIN (GLUCOPHAGE) 1000 MG tablet    Sig: Take 1 tablet (1,000 mg total) by mouth 2 (two) times daily with a meal.    Dispense:  180 tablet    Refill:  1    Follow-up: No follow-ups on file.    Forrest Moron, MD

## 2019-03-19 LAB — MICROALBUMIN, URINE: Microalbumin, Urine: 10.9 ug/mL

## 2019-03-22 ENCOUNTER — Encounter: Payer: Self-pay | Admitting: Radiology

## 2019-04-03 ENCOUNTER — Other Ambulatory Visit: Payer: Self-pay | Admitting: Family Medicine

## 2019-04-03 DIAGNOSIS — E785 Hyperlipidemia, unspecified: Secondary | ICD-10-CM

## 2019-07-04 IMAGING — MG DIGITAL SCREENING BILATERAL MAMMOGRAM WITH CAD
5 series · 5 of 5 positions shown · non-contrast
Comparison: Previous exam(s).

CLINICAL DATA: Screening.

EXAM:
DIGITAL SCREENING BILATERAL MAMMOGRAM WITH CAD

[L MLO (1 of 2)]
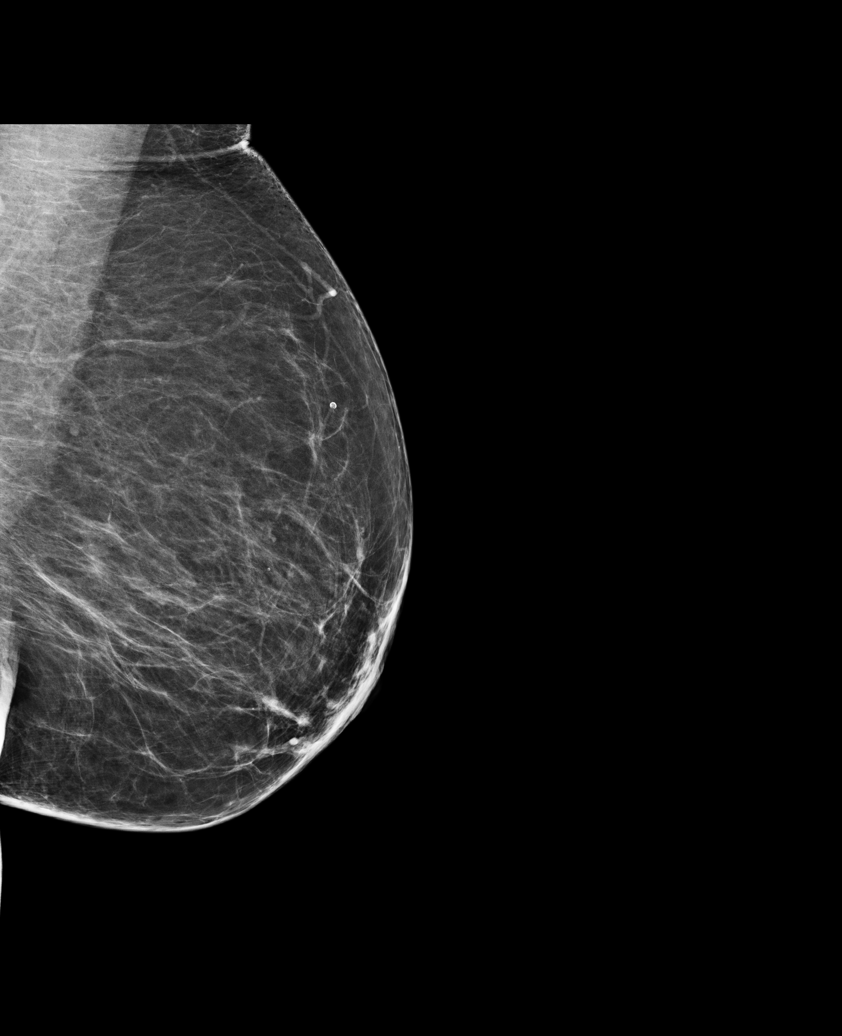

[R MLO]
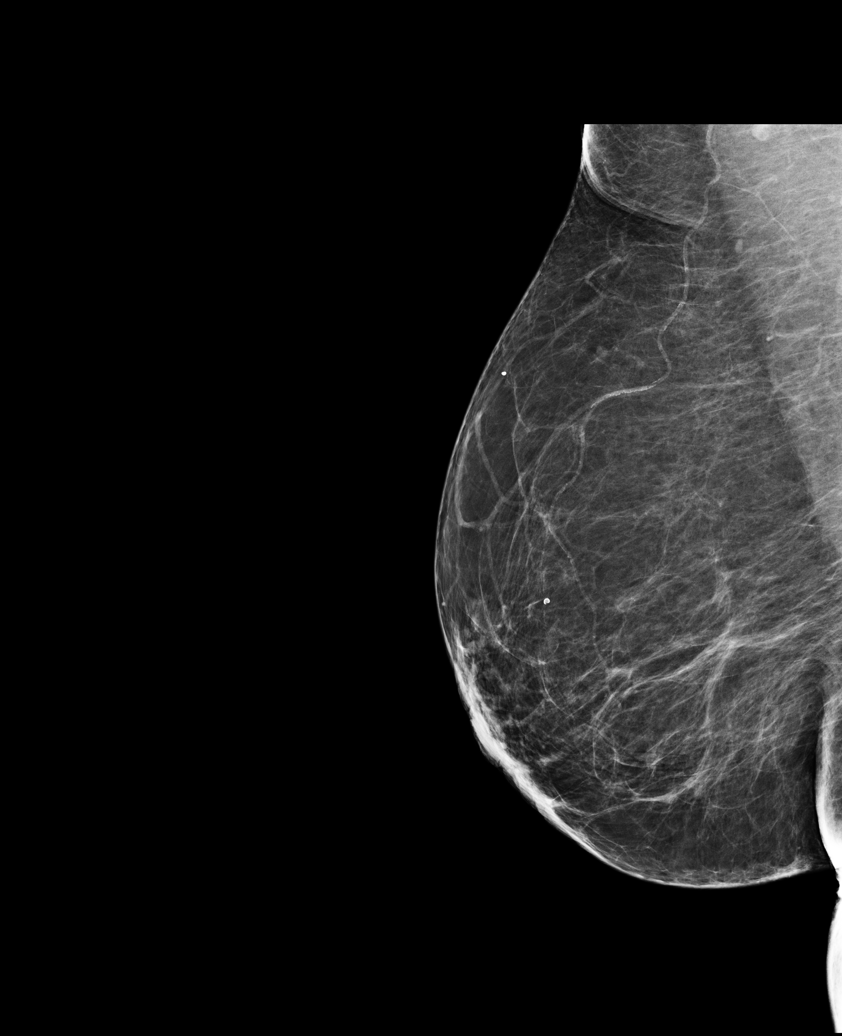

[L MLO (2 of 2)]
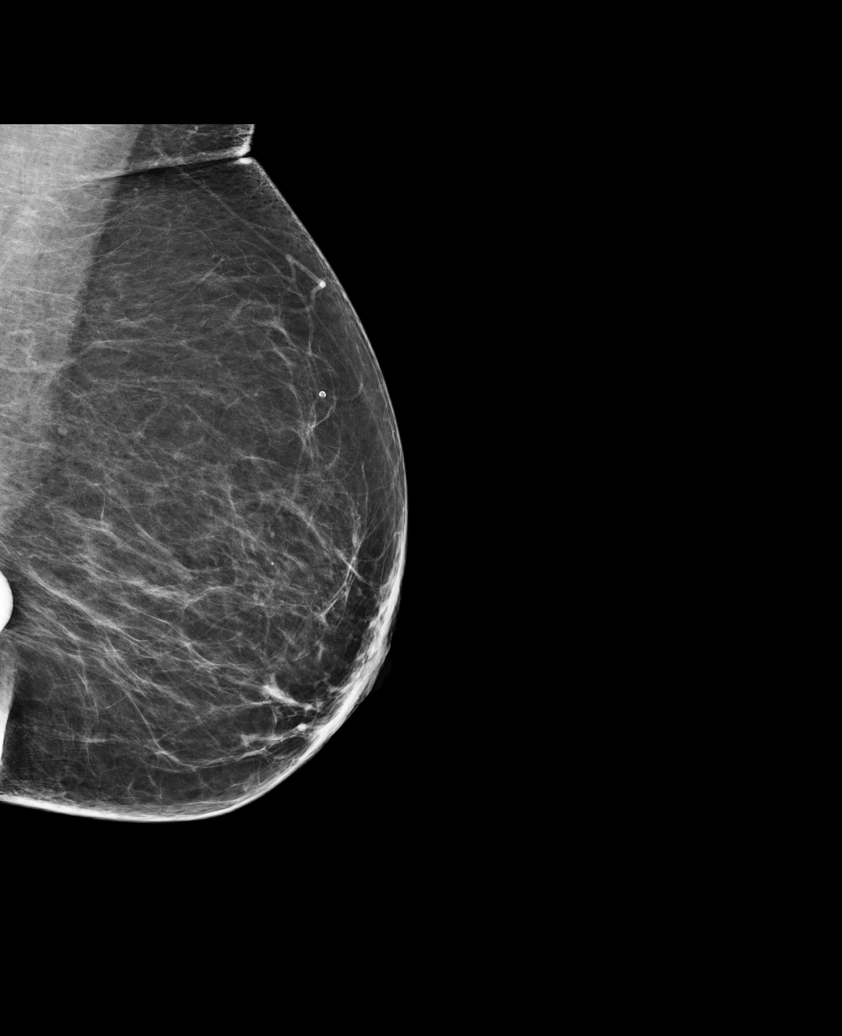

[L CC]
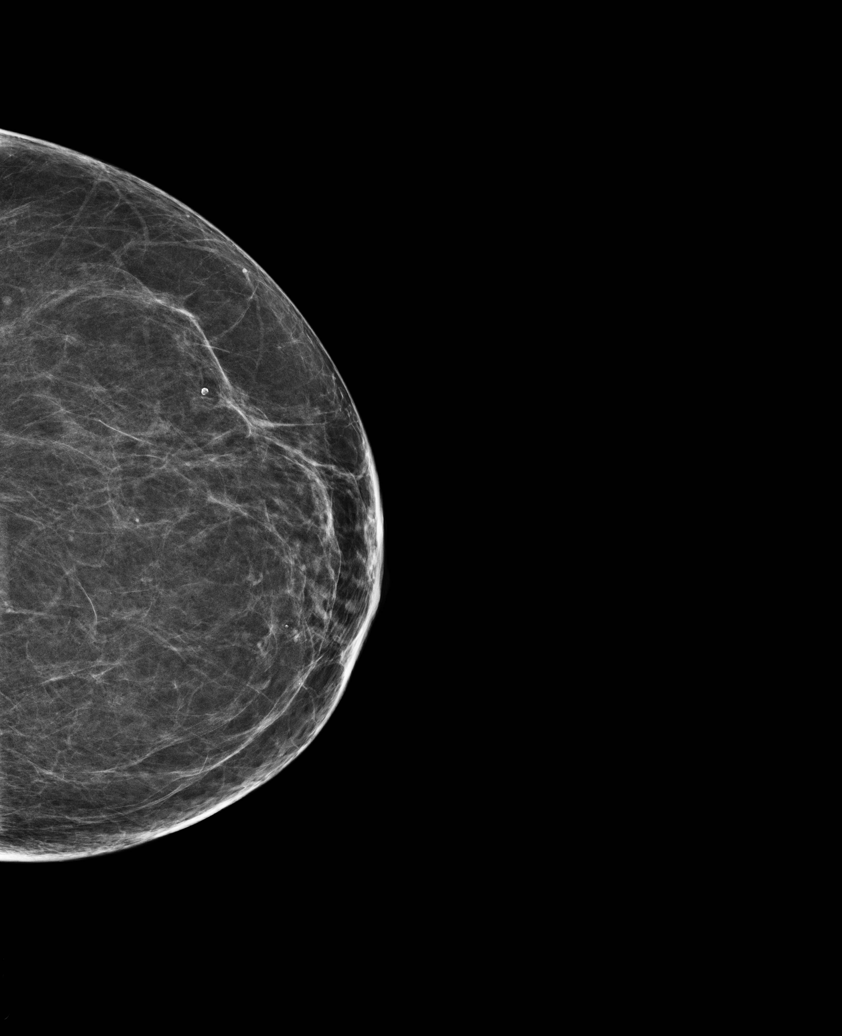

[R CC]
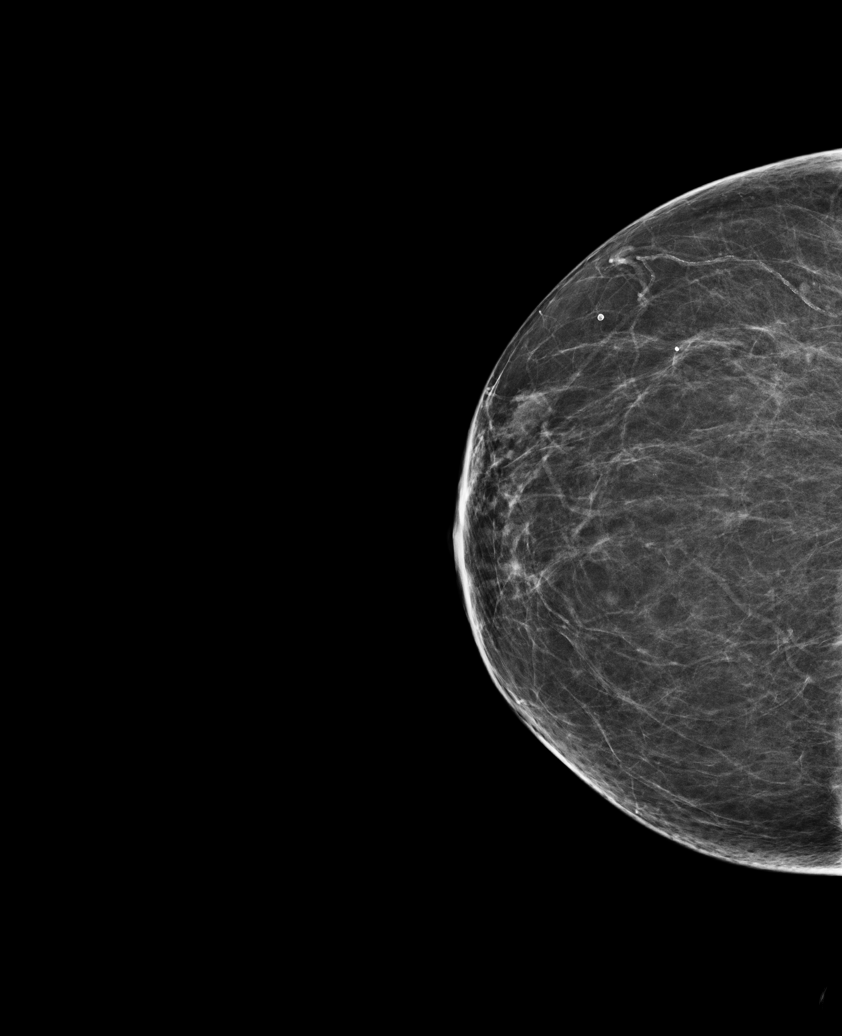

[5 of 5 positions shown; findings below may reference images not displayed]

ACR Breast Density Category b: There are scattered areas of
fibroglandular density.
FINDINGS: There are no findings suspicious for malignancy. Images were
processed with CAD.
IMPRESSION: No mammographic evidence of malignancy. A result letter of this
screening mammogram will be mailed directly to the patient.

RECOMMENDATION:
Screening mammogram in one year. (Code:AS-G-LCT)

BI-RADS CATEGORY  1: Negative.

## 2019-08-18 ENCOUNTER — Ambulatory Visit: Payer: Medicare Other | Attending: Internal Medicine

## 2019-08-18 DIAGNOSIS — Z23 Encounter for immunization: Secondary | ICD-10-CM | POA: Insufficient documentation

## 2019-08-18 NOTE — Progress Notes (Signed)
   Covid-19 Vaccination Clinic  Name:  Megan Wolfe    MRN: [id number] DOB: December 31, 1935  08/18/2019  Megan Wolfe was observed post Covid-19 immunization for 15 minutes without incidence. She was provided with Vaccine Information Sheet and instruction to access the V-Safe system.   Megan Wolfe was instructed to call 911 with any severe reactions post vaccine: Marland Kitchen Difficulty breathing  . Swelling of your face and throat  . A fast heartbeat  . A bad rash all over your body  . Dizziness and weakness    Immunizations Administered    Name Date Dose VIS Date Route   Pfizer COVID-19 Vaccine 08/18/2019  8:32 AM 0.3 mL 06/03/2019 Intramuscular   Manufacturer: [place]   Lot: [id number]   [place]: [id number]

## 2019-08-27 ENCOUNTER — Other Ambulatory Visit: Payer: Self-pay | Admitting: Family Medicine

## 2019-08-27 NOTE — Telephone Encounter (Signed)
Requested Prescriptions  Pending Prescriptions Disp Refills  . TRUE METRIX BLOOD GLUCOSE TEST test strip [Pharmacy Med Name: TRUE METRIX GLUCOSE TEST STRIP] 100 strip 12    Sig: TEST BLOOD GLUCOSE THREE TIMES A DAY. USE AS INSTRUCTED     Endocrinology: Diabetes - Testing Supplies Passed - 08/27/2019  8:41 AM      Passed - Valid encounter within last 12 months    Recent Outpatient Visits          5 months ago Type 2 diabetes mellitus with hyperglycemia, without long-term current use of insulin ([place])   Primary Care at [facility], Arlie Solomons, MD   10 months ago Medicare annual wellness visit, subsequent   Primary Care at [facility], Lilia Argue, MD   1 year ago Type 2 diabetes mellitus without complication, without long-term current use of insulin [facility])   Primary Care at [facility], Arlie Solomons, MD   2 years ago Decreased hearing of left ear   Primary Care at [facility], [place] A, MD   2 years ago Type 2 diabetes mellitus with complication, without long-term current use of insulin [facility])   Primary Care at [place], MD      Future Appointments            In 2 weeks Forrest Moron, MD Primary Care at [place], [facility]

## 2019-09-07 ENCOUNTER — Ambulatory Visit: Payer: Medicare Other | Attending: Internal Medicine

## 2019-09-07 DIAGNOSIS — Z23 Encounter for immunization: Secondary | ICD-10-CM

## 2019-09-07 NOTE — Progress Notes (Signed)
   Covid-19 Vaccination Clinic  Name:  Megan Wolfe    MRN: [id number] DOB: [birth year]  09/07/2019  [name] was observed post Covid-19 immunization for 15 minutes without incident. She was provided with Vaccine Information Sheet and instruction to access the V-Safe system.   [name] was instructed to call 911 with any severe reactions post vaccine: Marland Kitchen Difficulty breathing  . Swelling of face and throat  . A fast heartbeat  . A bad rash all over body  . Dizziness and weakness   Immunizations Administered    Name Date Dose VIS Date Route   Pfizer COVID-19 Vaccine 09/07/2019 11:21 AM 0.3 mL 06/03/2019 Intramuscular   Manufacturer: [place]   Lot: [id number]   [place]: [id number]

## 2019-09-16 ENCOUNTER — Other Ambulatory Visit: Payer: Self-pay

## 2019-09-16 ENCOUNTER — Ambulatory Visit (INDEPENDENT_AMBULATORY_CARE_PROVIDER_SITE_OTHER): Payer: Medicare Other | Admitting: Family Medicine

## 2019-09-16 VITALS — BP 122/78 | HR 78 | Temp 98.0°F | Resp 15 | Ht 64.0 in | Wt 129.4 lb

## 2019-09-16 DIAGNOSIS — E1165 Type 2 diabetes mellitus with hyperglycemia: Secondary | ICD-10-CM | POA: Diagnosis not present

## 2019-09-16 DIAGNOSIS — Z13820 Encounter for screening for osteoporosis: Secondary | ICD-10-CM

## 2019-09-16 DIAGNOSIS — E785 Hyperlipidemia, unspecified: Secondary | ICD-10-CM

## 2019-09-16 DIAGNOSIS — Z23 Encounter for immunization: Secondary | ICD-10-CM | POA: Diagnosis not present

## 2019-09-16 LAB — COMPREHENSIVE METABOLIC PANEL
ALT: 9 IU/L (ref 0–32)
AST: 16 IU/L (ref 0–40)
Albumin/Globulin Ratio: 1.4 (ref 1.2–2.2)
Albumin: 4.4 g/dL (ref 3.6–4.6)
Alkaline Phosphatase: 82 IU/L (ref 39–117)
BUN/Creatinine Ratio: 10 — ABNORMAL LOW (ref 12–28)
BUN: 9 mg/dL (ref 8–27)
Bilirubin Total: 0.3 mg/dL (ref 0.0–1.2)
CO2: 20 mmol/L (ref 20–29)
Calcium: 9.8 mg/dL (ref 8.7–10.3)
Chloride: 105 mmol/L (ref 96–106)
Creatinine, Ser: 0.87 mg/dL (ref 0.57–1.00)
GFR calc Af Amer: 72 mL/min/{1.73_m2} (ref >59)
GFR calc non Af Amer: 62 mL/min/{1.73_m2} (ref >59)
Globulin, Total: 3.1 g/dL (ref 1.5–4.5)
Glucose: 201 mg/dL — ABNORMAL HIGH (ref 65–99)
Potassium: 4 mmol/L (ref 3.5–5.2)
Sodium: 141 mmol/L (ref 134–144)
Total Protein: 7.5 g/dL (ref 6.0–8.5)

## 2019-09-16 LAB — LIPID PANEL
Chol/HDL Ratio: 3.4 ratio (ref 0.0–4.4)
Cholesterol, Total: 188 mg/dL (ref 100–199)
HDL: 56 mg/dL (ref >39)
LDL Chol Calc (NIH): 109 mg/dL — ABNORMAL HIGH (ref 0–99)
Triglycerides: 131 mg/dL (ref 0–149)
VLDL Cholesterol Cal: 23 mg/dL (ref 5–40)

## 2019-09-16 LAB — POCT GLYCOSYLATED HEMOGLOBIN (HGB A1C): Hemoglobin A1C: 7.7 % — AB (ref 4.0–5.6)

## 2019-09-16 NOTE — Progress Notes (Signed)
Established Patient Office Visit  Subjective:  Patient ID: Megan Wolfe, female    DOB: Sep 09, 1935  Age: 84 y.o. MRN: [id number]  CC:  Chief Complaint  Patient presents with  . Diabetes    pt has been running 120-140 usually takes it late morning early afternoon, pt states she keeps losing weight but is trying to gain weight, states she goes walking in the mall but nothing strenuous.    HPI Megan Wolfe presents for   Dyslipidemia: Patient here for follow-up of dyslipidemia. She is exercising and is adherent to low salt diet.  Blood pressure is well controlled at home. Cardiac symptoms none. Patient denies chest pain, claudication, exertional chest pressure/discomfort, fatigue, lower extremity edema and near-syncope.  Cardiovascular risk factors: advanced age (older than 64 for men, 50 for women), diabetes mellitus, dyslipidemia and hypertension.  BP Readings from Last 3 Encounters:  09/16/19 122/78  03/18/19 (!) 146/74  10/13/18 116/70   Lab Results  Component Value Date   CHOL 188 09/16/2019   CHOL 178 03/18/2019   CHOL 223 (H) 01/26/2018   Lab Results  Component Value Date   HDL 56 09/16/2019   HDL 44 03/18/2019   HDL 47 01/26/2018   Lab Results  Component Value Date   LDLCALC 109 (H) 09/16/2019   LDLCALC 114 (H) 03/18/2019   LDLCALC 141 (H) 01/26/2018   Lab Results  Component Value Date   TRIG 131 09/16/2019   TRIG 110 03/18/2019   TRIG 173 (H) 01/26/2018   Lab Results  Component Value Date   CHOLHDL 3.4 09/16/2019   CHOLHDL 4.0 03/18/2019   CHOLHDL 4.7 (H) 01/26/2018   No results found for: LDLDIRECT   Diabetes Mellitus: Patient presents for follow up of diabetes. Symptoms: none. Symptoms have stabilized. Patient denies foot ulcerations, hypoglycemia , increase appetite, nausea, polydipsia and polyuria.  Evaluation to date has been included: hemoglobin A1C.  Home sugars: BGs consistently in an acceptable range. Treatment to date: Continued  metformin which has been effective.  Lab Results  Component Value Date   HGBA1C 7.7 (A) 09/16/2019      Past Medical History:  Diagnosis Date  . Diabetes mellitus without complication ([place])   . High cholesterol     Past Surgical History:  Procedure Laterality Date  . ABDOMINAL HYSTERECTOMY      Family History  Problem Relation Age of Onset  . Diabetes Mother   . Diabetes Sister   . Breast cancer Neg Hx     Social History   Socioeconomic History  . Marital status: Legally Separated    Spouse name: Not on file  . Number of children: 1  . Years of education: Not on file  . Highest education level: Master's degree (e.g., MA, MS, MEng, MEd, MSW, MBA)  Occupational History  . Not on file  Tobacco Use  . Smoking status: Never Smoker  . Smokeless tobacco: Never Used  Substance and Sexual Activity  . Alcohol use: Yes    Comment: red wine occasional   . Drug use: No  . Sexual activity: Never  Other Topics Concern  . Not on file  Social History Narrative  . Not on file   Social Determinants of Health   Financial Resource Strain:   . Difficulty of Paying Living Expenses:   Food Insecurity:   . Worried About Charity fundraiser in the Last Year:   . Arboriculturist in the Last Year:   Transportation Needs:   .  Lack of Transportation (Medical):   Marland Kitchen Lack of Transportation (Non-Medical):   Physical Activity:   . Days of Exercise per Week:   . Minutes of Exercise per Session:   Stress:   . Feeling of Stress :   Social Connections:   . Frequency of Communication with Friends and Family:   . Frequency of Social Gatherings with Friends and Family:   . Attends Religious Services:   . Active Member of Clubs or Organizations:   . Attends Archivist Meetings:   Marland Kitchen Marital Status:   Intimate Partner Violence:   . Fear of Current or Ex-Partner:   . Emotionally Abused:   Marland Kitchen Physically Abused:   . Sexually Abused:     Outpatient Medications Prior to Visit    Medication Sig Dispense Refill  . aspirin EC 81 MG tablet Take 324 mg by mouth once.    . metFORMIN (GLUCOPHAGE) 1000 MG tablet Take 1 tablet (1,000 mg total) by mouth 2 (two) times daily with a meal. 180 tablet 1  . rosuvastatin (CRESTOR) 10 MG tablet TAKE 1 TABLET BY MOUTH EVERY OTHER DAY. PLEASE SCHEDULE AN OFFICE VISIT 90 tablet 1  . tetrahydrozoline 0.05 % ophthalmic solution Place 1 drop into both eyes as needed. For dry eyes.    . TRUE METRIX BLOOD GLUCOSE TEST test strip TEST BLOOD GLUCOSE THREE TIMES A DAY. USE AS INSTRUCTED 100 strip 12  . fluticasone (FLONASE) 50 MCG/ACT nasal spray Place 2 sprays into both nostrils daily. (Patient not taking: Reported on 09/16/2019) 16 g 6   No facility-administered medications prior to visit.    No Known Allergies  ROS Review of Systems Review of Systems  Constitutional: Negative for activity change, appetite change, chills and fever.  HENT: Negative for congestion, nosebleeds, trouble swallowing and voice change.   Respiratory: Negative for cough, shortness of breath and wheezing.   Gastrointestinal: Negative for diarrhea, nausea and vomiting.  Genitourinary: Negative for difficulty urinating, dysuria, flank pain and hematuria.  Musculoskeletal: Negative for back pain, joint swelling and neck pain.  Neurological: Negative for dizziness, speech difficulty, light-headedness and numbness.  See HPI. All other review of systems negative.     Objective:    Physical Exam  BP 122/78   Pulse 78   Temp 98 F (36.7 C) (Temporal)   Resp 15   Ht 5\' 4"  (1.626 m)   Wt 129 lb 6.4 oz (58.7 kg)   SpO2 98%   BMI 22.21 kg/m  Wt Readings from Last 3 Encounters:  09/16/19 129 lb 6.4 oz (58.7 kg)  03/18/19 130 lb 12.8 oz (59.3 kg)  10/13/18 132 lb (59.9 kg)   Physical Exam  Constitutional: Oriented to person, place, and time. Appears well-developed and well-nourished.  HENT:  Head: Normocephalic and atraumatic.  Eyes: Conjunctivae and EOM are  normal.  Cardiovascular: Normal rate, regular rhythm, normal heart sounds and intact distal pulses.  No murmur heard. Pulmonary/Chest: Effort normal and breath sounds normal. No stridor. No respiratory distress. Has no wheezes.  Neurological: Is alert and oriented to person, place, and time.  Skin: Skin is warm. Capillary refill takes less than 2 seconds.  Psychiatric: Has a normal mood and affect. Behavior is normal. Judgment and thought content normal.    Health Maintenance Due  Topic Date Due  . DEXA SCAN  Never done  . OPHTHALMOLOGY EXAM  11/06/2018    There are no preventive care reminders to display for this patient.  No results found for: TSH  Lab Results  Component Value Date   WBC 4.0 03/18/2019   HGB 13.0 03/18/2019   HCT 37.7 03/18/2019   MCV 90 03/18/2019   PLT 286 03/18/2019   Lab Results  Component Value Date   NA 141 09/16/2019   K 4.0 09/16/2019   CO2 20 09/16/2019   GLUCOSE 201 (H) 09/16/2019   BUN 9 09/16/2019   CREATININE 0.87 09/16/2019   BILITOT 0.3 09/16/2019   ALKPHOS 82 09/16/2019   AST 16 09/16/2019   ALT 9 09/16/2019   PROT 7.5 09/16/2019   ALBUMIN 4.4 09/16/2019   CALCIUM 9.8 09/16/2019   ANIONGAP 9 01/23/2017   Lab Results  Component Value Date   CHOL 188 09/16/2019   Lab Results  Component Value Date   HDL 56 09/16/2019   Lab Results  Component Value Date   LDLCALC 109 (H) 09/16/2019   Lab Results  Component Value Date   TRIG 131 09/16/2019   Lab Results  Component Value Date   CHOLHDL 3.4 09/16/2019   Lab Results  Component Value Date   HGBA1C 7.7 (A) 09/16/2019      Assessment & Plan:   Problem List Items Addressed This Visit    None    Visit Diagnoses    Type 2 diabetes mellitus with hyperglycemia, without long-term current use of insulin ([place])    -  Primary well controlled hemoglobin a1c is at goal Continue exercise Lipids monitored and renal function in range On metformin On ace or arb On asa  81mg  Reviewed diabetic foot care Emphasized importance of eye and dental exam      Relevant Orders   POCT glycosylated hemoglobin (Hb A1C) (Completed)   Comprehensive metabolic panel (Completed)   Dyslipidemia    -  Discussed lipid mgmt Continue healthy diet   Relevant Orders   Lipid panel (Completed)   Comprehensive metabolic panel (Completed)   Screening for osteoporosis       Need for Tdap vaccination          No orders of the defined types were placed in this encounter.   Follow-up: Return in about 6 months (around 03/18/2020) for diabetes follow up .    Forrest Moron, MD

## 2019-09-16 NOTE — Patient Instructions (Signed)
° ° ° °  If you have lab work done today you will be contacted with your lab results within the next 2 weeks.  If you have not heard from us then please contact us. The fastest way to get your results is to register for My Chart. ° ° °IF you received an x-ray today, you will receive an invoice from [facility]. Please contact [facility] at [phone] with questions or concerns regarding your invoice.  ° °IF you received labwork today, you will receive an invoice from [facility]. Please contact [facility] at [phone] with questions or concerns regarding your invoice.  ° °Our billing staff will not be able to assist you with questions regarding bills from these companies. ° °You will be contacted with the lab results as soon as they are available. The fastest way to get your results is to activate your My Chart account. Instructions are located on the last page of this paperwork. If you have not heard from us regarding the results in 2 weeks, please contact this office. °  ° ° ° °

## 2019-09-28 ENCOUNTER — Encounter: Payer: Self-pay | Admitting: Radiology

## 2019-10-04 ENCOUNTER — Telehealth: Payer: Self-pay | Admitting: Family Medicine

## 2019-10-04 NOTE — Telephone Encounter (Signed)
10/04/2019 - I WAS SUPPOSE TO CALL PATIENT WHEN DR. Nolon Rod CALENDER OPENED UP FOR HER 6 MONTH RECHECK ON HER DIABETES FOR SEPT. 2021. SHE SAW DR. Nolon Rod ON 09/16/2019 I CALLED TO LET HER KNOW THAT DR. STALLINGS WILL BE LEAVING PRIMARY CARE AT [facility] IN MAY 2021. I GAVE HER THE OPTIONS OF FOLLOWING DR. Nolon Rod OR TRANSFERRING HER CARE TO ANOTHER ONE OF OUR PROVIDERS. SHE STATED SHE REALLY WOULD LIKE TO STAY WITH DR. Nolon Rod BUT [place] MIGHT BE TOO FAR. SHE WANTS TO THINK ABOUT IT AND THEN SHE WILL CALL us BACK. [place]

## 2019-10-16 ENCOUNTER — Encounter: Payer: Self-pay | Admitting: Family Medicine

## 2019-11-09 ENCOUNTER — Ambulatory Visit: Payer: Medicare Other | Admitting: Registered Nurse

## 2019-11-09 VITALS — BP 122/78 | Ht 64.0 in | Wt 129.0 lb

## 2019-11-09 DIAGNOSIS — Z0000 Encounter for general adult medical examination without abnormal findings: Secondary | ICD-10-CM

## 2019-11-09 NOTE — Patient Instructions (Addendum)
Thank you for taking time to come for your Medicare Wellness Visit. I appreciate your ongoing commitment to your health goals. Please review the following plan we discussed and let me know if I can assist you in the future.  Starr Urias LPN  Preventive Care 82 Years and Older, Female Preventive care refers to lifestyle choices and visits with your health care provider that can promote health and wellness. This includes:  A yearly physical exam. This is also called an annual well check.  Regular dental and eye exams.  Immunizations.  Screening for certain conditions.  Healthy lifestyle choices, such as diet and exercise. What can I expect for my preventive care visit? Physical exam Your health care provider will check:  Height and weight. These may be used to calculate body mass index (BMI), which is a measurement that tells if you are at a healthy weight.  Heart rate and blood pressure.  Your skin for abnormal spots. Counseling Your health care provider may ask you questions about:  Alcohol, tobacco, and drug use.  Emotional well-being.  Home and relationship well-being.  Sexual activity.  Eating habits.  History of falls.  Memory and ability to understand (cognition).  Work and work environment.  Pregnancy and menstrual history. What immunizations do I need?  Influenza (flu) vaccine  This is recommended every year. Tetanus, diphtheria, and pertussis (Tdap) vaccine  You may need a Td booster every 10 years. Varicella (chickenpox) vaccine  You may need this vaccine if you have not already been vaccinated. Zoster (shingles) vaccine  You may need this after age 82. Pneumococcal conjugate (PCV13) vaccine  One dose is recommended after age 82. Pneumococcal polysaccharide (PPSV23) vaccine  One dose is recommended after age 82. Measles, mumps, and rubella (MMR) vaccine  You may need at least one dose of MMR if you were born in 1957 or later. You may also  need a second dose. Meningococcal conjugate (MenACWY) vaccine  You may need this if you have certain conditions. Hepatitis A vaccine  You may need this if you have certain conditions or if you travel or work in places where you may be exposed to hepatitis A. Hepatitis B vaccine  You may need this if you have certain conditions or if you travel or work in places where you may be exposed to hepatitis B. Haemophilus influenzae type b (Hib) vaccine  You may need this if you have certain conditions. You may receive vaccines as individual doses or as more than one vaccine together in one shot (combination vaccines). Talk with your health care provider about the risks and benefits of combination vaccines. What tests do I need? Blood tests  Lipid and cholesterol levels. These may be checked every 5 years, or more frequently depending on your overall health.  Hepatitis C test.  Hepatitis B test. Screening  Lung cancer screening. You may have this screening every year starting at age 55 if you have a 30-pack-year history of smoking and currently smoke or have quit within the past 15 years.  Colorectal cancer screening. All adults should have this screening starting at age 50 and continuing until age 75. Your health care provider may recommend screening at age 45 if you are at increased risk. You will have tests every 1-10 years, depending on your results and the type of screening test.  Diabetes screening. This is done by checking your blood sugar (glucose) after you have not eaten for a while (fasting). You may have this done every 1-3   years.  Mammogram. This may be done every 1-2 years. Talk with your health care provider about how often you should have regular mammograms.  BRCA-related cancer screening. This may be done if you have a family history of breast, ovarian, tubal, or peritoneal cancers. Other tests  Sexually transmitted disease (STD) testing.  Bone density scan. This is done  to screen for osteoporosis. You may have this done starting at age 82. Follow these instructions at home: Eating and drinking  Eat a diet that includes fresh fruits and vegetables, whole grains, lean protein, and low-fat dairy products. Limit your intake of foods with high amounts of sugar, saturated fats, and salt.  Take vitamin and mineral supplements as recommended by your health care provider.  Do not drink alcohol if your health care provider tells you not to drink.  If you drink alcohol: ? Limit how much you have to 0-1 drink a day. ? Be aware of how much alcohol is in your drink. In the [place] one drink equals one 12 oz bottle of beer (355 mL), one 5 oz glass of wine (148 mL), or one 1 oz glass of hard liquor (44 mL). Lifestyle  Take daily care of your teeth and gums.  Stay active. Exercise for at least 30 minutes on 5 or more days each week.  Do not use any products that contain nicotine or tobacco, such as cigarettes, e-cigarettes, and chewing tobacco. If you need help quitting, ask your health care provider.  If you are sexually active, practice safe sex. Use a condom or other form of protection in order to prevent STIs (sexually transmitted infections).  Talk with your health care provider about taking a low-dose aspirin or statin. What's next?  Go to your health care provider once a year for a well check visit.  Ask your health care provider how often you should have your eyes and teeth checked.  Stay up to date on all vaccines. This information is not intended to replace advice given to you by your health care provider. Make sure you discuss any questions you have with your health care provider. Document Revised: 06/03/2018 Document Reviewed: 06/03/2018 Elsevier Patient Education  2020 [employer].

## 2019-11-09 NOTE — Progress Notes (Signed)
Presents today for [employer] Visit   Date of last exam: 09-16-2019  Interpreter used for this visit?  No  I connected with  Guido Sander on 11/09/19 by telephone and verified that I am speaking with the correct person using two identifiers.   I discussed the limitations of evaluation and management by telemedicine. The patient expressed understanding and agreed to proceed.    Patient Care Team: Forrest Moron, MD as PCP - General (Internal Medicine) Rutherford Guys, MD as Consulting Physician (Ophthalmology)   Other items to address today:  Discussed Immunizations Discussed Eye/Dental    Other Screening: Last screening for diabetes: 09/16/2019 Last lipid screening: 09-16-2019  ADVANCE DIRECTIVES: Discussed: yes On File: no Materials Provided: no  Immunization status:  Immunization History  Administered Date(s) Administered  . Fluad Quad(high Dose 65+) 03/18/2019  . Influenza,inj,Quad PF,6+ Mos 02/20/2017  . PFIZER SARS-COV-2 Vaccination 08/18/2019, 09/07/2019  . Pneumococcal Conjugate-13 06/05/2014  . Pneumococcal Polysaccharide-23 04/12/2007     Health Maintenance Due  Topic Date Due  . DEXA SCAN  Never done  . OPHTHALMOLOGY EXAM  11/06/2018     Functional Status Survey: Is the patient deaf or have difficulty hearing?: No Does the patient have difficulty seeing, even when wearing glasses/contacts?: No Does the patient have difficulty concentrating, remembering, or making decisions?: No Does the patient have difficulty walking or climbing stairs?: No Does the patient have difficulty dressing or bathing?: No Does the patient have difficulty doing errands alone such as visiting a doctor's office or shopping?: No   6CIT Screen 11/09/2019 10/13/2018 07/27/2017  What Year? 0 points 0 points 0 points  What month? 0 points 0 points 0 points  What time? 0 points 0 points 0 points  Count back from 20 0 points 0 points 0 points  Months in  reverse 2 points 0 points 0 points  Repeat phrase 0 points 0 points 8 points  Total Score 2 0 8        Clinical Support from 11/09/2019 in [facility]  AUDIT-C Score  4       Home Environment:   Lives in one story home No scattered rugs No trouble climbing stairs No grab bars Adequate lighting/ no clutter   Patient Active Problem List   Diagnosis Date Noted  . Pseudophakia 11/04/2016  . Type 2 diabetes mellitus without complication, without long-term current use of insulin ([place]) 10/22/2016  . Urticaria 10/22/2016     Past Medical History:  Diagnosis Date  . Diabetes mellitus without complication ([place])   . High cholesterol      Past Surgical History:  Procedure Laterality Date  . ABDOMINAL HYSTERECTOMY       Family History  Problem Relation Age of Onset  . Diabetes Mother   . Diabetes Sister   . Breast cancer Neg Hx      Social History   Socioeconomic History  . Marital status: Legally Separated    Spouse name: Not on file  . Number of children: 1  . Years of education: Not on file  . Highest education level: Master's degree (e.g., MA, MS, MEng, MEd, MSW, MBA)  Occupational History  . Not on file  Tobacco Use  . Smoking status: Never Smoker  . Smokeless tobacco: Never Used  Substance and Sexual Activity  . Alcohol use: Yes    Comment: red wine occasional   . Drug use: No  . Sexual activity: Never  Other Topics Concern  .  Not on file  Social History Narrative  . Not on file   Social Determinants of Health   Financial Resource Strain:   . Difficulty of Paying Living Expenses:   Food Insecurity:   . Worried About Charity fundraiser in the Last Year:   . Arboriculturist in the Last Year:   Transportation Needs:   . Film/video editor (Medical):   Marland Kitchen Lack of Transportation (Non-Medical):   Physical Activity:   . Days of Exercise per Week:   . Minutes of Exercise per Session:   Stress:   . Feeling of Stress :   Social  Connections:   . Frequency of Communication with Friends and Family:   . Frequency of Social Gatherings with Friends and Family:   . Attends Religious Services:   . Active Member of Clubs or Organizations:   . Attends Archivist Meetings:   Marland Kitchen Marital Status:   Intimate Partner Violence:   . Fear of Current or Ex-Partner:   . Emotionally Abused:   Marland Kitchen Physically Abused:   . Sexually Abused:      No Known Allergies   Prior to Admission medications   Medication Sig Start Date End Date Taking? Authorizing Provider  aspirin EC 81 MG tablet Take 324 mg by mouth once.   Yes [provider]  metFORMIN (GLUCOPHAGE) 1000 MG tablet Take 1 tablet (1,000 mg total) by mouth 2 (two) times daily with a meal. 03/18/19  Yes Stallings, Zoe A, MD  rosuvastatin (CRESTOR) 10 MG tablet TAKE 1 TABLET BY MOUTH EVERY OTHER DAY. PLEASE SCHEDULE AN OFFICE VISIT 04/03/19  Yes Stallings, Zoe A, MD  tetrahydrozoline 0.05 % ophthalmic solution Place 1 drop into both eyes as needed. For dry eyes.   Yes [provider]  TRUE METRIX BLOOD GLUCOSE TEST test strip TEST BLOOD GLUCOSE THREE TIMES A DAY. USE AS INSTRUCTED 08/27/19  Yes Forrest Moron, MD     Depression screen [facility] 2/9 11/09/2019 09/16/2019 03/18/2019 10/13/2018 01/26/2018  Decreased Interest 0 0 0 0 0  Down, Depressed, Hopeless 0 0 0 0 0  PHQ - 2 Score 0 0 0 0 0     Fall Risk  11/09/2019 09/16/2019 03/18/2019 10/13/2018 01/26/2018  Falls in the past year? 0 0 0 0 No  Number falls in past yr: 0 - 0 0 -  Injury with Fall? 0 - 0 0 -  Follow up Falls evaluation completed;Education provided Falls evaluation completed Falls evaluation completed - -      PHYSICAL EXAM: BP 122/78 Comment: taken from a previous visit  Ht 5\' 4"  (1.626 m)   Wt 129 lb (58.5 kg)   BMI 22.14 kg/m    Wt Readings from Last 3 Encounters:  11/09/19 129 lb (58.5 kg)  09/16/19 129 lb 6.4 oz (58.7 kg)  03/18/19 130 lb 12.8 oz (59.3 kg)        Education/Counseling provided regarding diet and exercise, prevention of chronic diseases, smoking/tobacco cessation, if applicable, and reviewed "Covered Medicare Preventive Services."

## 2019-11-28 DIAGNOSIS — Z961 Presence of intraocular lens: Secondary | ICD-10-CM | POA: Diagnosis not present

## 2019-11-28 DIAGNOSIS — E119 Type 2 diabetes mellitus without complications: Secondary | ICD-10-CM | POA: Diagnosis not present

## 2019-11-28 LAB — HM DIABETES EYE EXAM

## 2019-12-13 DIAGNOSIS — E11638 Type 2 diabetes mellitus with other oral complications: Secondary | ICD-10-CM | POA: Diagnosis not present

## 2019-12-13 DIAGNOSIS — R030 Elevated blood-pressure reading, without diagnosis of hypertension: Secondary | ICD-10-CM | POA: Diagnosis not present

## 2019-12-27 DIAGNOSIS — E11638 Type 2 diabetes mellitus with other oral complications: Secondary | ICD-10-CM | POA: Diagnosis not present

## 2020-01-12 DIAGNOSIS — I10 Essential (primary) hypertension: Secondary | ICD-10-CM | POA: Diagnosis not present

## 2020-01-12 DIAGNOSIS — E11638 Type 2 diabetes mellitus with other oral complications: Secondary | ICD-10-CM | POA: Diagnosis not present

## 2020-01-23 DIAGNOSIS — R002 Palpitations: Secondary | ICD-10-CM | POA: Diagnosis not present

## 2020-02-23 DIAGNOSIS — Z0001 Encounter for general adult medical examination with abnormal findings: Secondary | ICD-10-CM | POA: Diagnosis not present

## 2020-02-23 DIAGNOSIS — E1165 Type 2 diabetes mellitus with hyperglycemia: Secondary | ICD-10-CM | POA: Diagnosis not present

## 2020-02-23 DIAGNOSIS — R0789 Other chest pain: Secondary | ICD-10-CM | POA: Diagnosis not present

## 2020-03-20 DIAGNOSIS — M25512 Pain in left shoulder: Secondary | ICD-10-CM | POA: Diagnosis not present

## 2020-03-28 ENCOUNTER — Ambulatory Visit: Payer: Medicare Other | Admitting: Registered Nurse

## 2020-04-25 ENCOUNTER — Encounter: Payer: Self-pay | Admitting: Family Medicine

## 2020-04-25 ENCOUNTER — Ambulatory Visit (INDEPENDENT_AMBULATORY_CARE_PROVIDER_SITE_OTHER): Payer: Medicare Other | Admitting: Family Medicine

## 2020-04-25 ENCOUNTER — Other Ambulatory Visit: Payer: Self-pay

## 2020-04-25 DIAGNOSIS — E785 Hyperlipidemia, unspecified: Secondary | ICD-10-CM

## 2020-04-25 DIAGNOSIS — I10 Essential (primary) hypertension: Secondary | ICD-10-CM

## 2020-04-25 DIAGNOSIS — E1165 Type 2 diabetes mellitus with hyperglycemia: Secondary | ICD-10-CM | POA: Diagnosis not present

## 2020-04-25 DIAGNOSIS — E118 Type 2 diabetes mellitus with unspecified complications: Secondary | ICD-10-CM

## 2020-04-25 LAB — HEMOGLOBIN A1C
Est. average glucose Bld gHb Est-mCnc: 169 mg/dL
Hgb A1c MFr Bld: 7.5 % — ABNORMAL HIGH (ref 4.8–5.6)

## 2020-04-25 LAB — CMP14+EGFR
ALT: 16 IU/L (ref 0–32)
AST: 18 IU/L (ref 0–40)
Albumin/Globulin Ratio: 1.4 (ref 1.2–2.2)
Albumin: 4.2 g/dL (ref 3.6–4.6)
Alkaline Phosphatase: 97 IU/L (ref 44–121)
BUN/Creatinine Ratio: 14 (ref 12–28)
BUN: 10 mg/dL (ref 8–27)
Bilirubin Total: <0.2 mg/dL (ref 0.0–1.2)
CO2: 23 mmol/L (ref 20–29)
Calcium: 9.7 mg/dL (ref 8.7–10.3)
Chloride: 103 mmol/L (ref 96–106)
Creatinine, Ser: 0.73 mg/dL (ref 0.57–1.00)
GFR calc Af Amer: 89 mL/min/{1.73_m2} (ref >59)
GFR calc non Af Amer: 77 mL/min/{1.73_m2} (ref >59)
Globulin, Total: 2.9 g/dL (ref 1.5–4.5)
Glucose: 239 mg/dL — ABNORMAL HIGH (ref 65–99)
Potassium: 3.9 mmol/L (ref 3.5–5.2)
Sodium: 139 mmol/L (ref 134–144)
Total Protein: 7.1 g/dL (ref 6.0–8.5)

## 2020-04-25 LAB — LIPID PANEL
Chol/HDL Ratio: 5.2 ratio — ABNORMAL HIGH (ref 0.0–4.4)
Cholesterol, Total: 238 mg/dL — ABNORMAL HIGH (ref 100–199)
HDL: 46 mg/dL (ref >39)
LDL Chol Calc (NIH): 170 mg/dL — ABNORMAL HIGH (ref 0–99)
Triglycerides: 123 mg/dL (ref 0–149)
VLDL Cholesterol Cal: 22 mg/dL (ref 5–40)

## 2020-04-25 MED ORDER — ROSUVASTATIN CALCIUM 10 MG PO TABS: ORAL_TABLET | ORAL | 3 refills | Status: DC

## 2020-04-25 MED ORDER — LISINOPRIL 5 MG PO TABS: 5.0000 mg | ORAL_TABLET | Freq: Every day | ORAL | 3 refills | Status: DC

## 2020-04-25 MED ORDER — METFORMIN HCL 1000 MG PO TABS: 1000.0000 mg | ORAL_TABLET | Freq: Every day | ORAL | 1 refills | Status: DC

## 2020-04-25 NOTE — Progress Notes (Signed)
11/3/20218:00 PM  NALANIE WINIECKI Aug 19, 1935, 84 y.o., female [id number]  Chief Complaint  Patient presents with  . Abdominal Pain    diarrhea x a week   . Hypertension    has not taken lisinopril x1 week  . Diabetes    HPI:   Patient is a 84 y.o. female with past medical history significant for HTN, DM, HLD who presents today for medication followup  Abdominal pain  intermittent 1 week off and on Diarrhea: loose stools following cramping Denies nausea, vomiting Tried Tylenol for the pain and this helped Never had this happen in the past Mid Lower abdominal pain Denies blood in stool Denies urinary symptoms Now has been constipated    Diabetes Lab Results  Component Value Date   HGBA1C 7.7 (A) 09/16/2019  Metformin 1086m daily bid (has not been taking since Sunday) She feels like the metformin is causing her GI issues Was previously on glimepride BG has been 120-130 in AM Crestor 125mdaily   Hypertension BP Readings from Last 3 Encounters:  11/09/19 122/78  09/16/19 122/78  03/18/19 (!) 146/74  Lisinopril 5 mg BP today:155/78 Takes BP at home  [facility] 04/25/2020 11/09/2019 09/16/2019  Decreased Interest 0 0 0  Down, Depressed, Hopeless 0 0 0  PHQ - 2 Score 0 0 0    Fall Risk  04/25/2020 11/09/2019 09/16/2019 03/18/2019 10/13/2018  Falls in the past year? 0 0 0 0 0  Number falls in past yr: 0 0 - 0 0  Injury with Fall? 0 0 - 0 0  Follow up Falls evaluation completed Falls evaluation completed;Education provided Falls evaluation completed Falls evaluation completed -     No Known Allergies  Prior to Admission medications   Medication Sig Start Date End Date Taking? Authorizing Provider  aspirin EC 81 MG tablet Take 324 mg by mouth once.    Yes [provider]  metFORMIN (GLUCOPHAGE) 1000 MG tablet Take 1 tablet (1,000 mg total) by mouth 2 (two) times daily with a meal. 03/18/19  Yes Stallings, Zoe A, MD  rosuvastatin  (CRESTOR) 10 MG tablet TAKE 1 TABLET BY MOUTH EVERY OTHER DAY. PLEASE SCHEDULE AN OFFICE VISIT 04/03/19  Yes Stallings, Zoe A, MD  tetrahydrozoline 0.05 % ophthalmic solution Place 1 drop into both eyes as needed. For dry eyes.   Yes [provider]  TRUE METRIX BLOOD GLUCOSE TEST test strip TEST BLOOD GLUCOSE THREE TIMES A DAY. USE AS INSTRUCTED 08/27/19  Yes StForrest MoronMD    Past Medical History:  Diagnosis Date  . Diabetes mellitus without complication ([place]  . High cholesterol     Past Surgical History:  Procedure Laterality Date  . ABDOMINAL HYSTERECTOMY      Social History   Tobacco Use  . Smoking status: Never Smoker  . Smokeless tobacco: Never Used  Substance Use Topics  . Alcohol use: Yes    Comment: red wine occasional     Family History  Problem Relation Age of Onset  . Diabetes Mother   . Diabetes Sister   . Breast cancer Neg Hx     Review of Systems  Constitutional: Negative for chills, fever and malaise/fatigue.  Eyes: Negative for blurred vision and double vision.  Respiratory: Negative for cough, shortness of breath and wheezing.   Cardiovascular: Negative for chest pain, palpitations and leg swelling.  Gastrointestinal: Positive for abdominal pain (intermittent cramping), constipation (intermittent) and diarrhea (intermittent). Negative for blood in stool, heartburn,  nausea and vomiting.  Genitourinary: Negative for dysuria, flank pain, frequency, hematuria and urgency.  Musculoskeletal: Negative for back pain and joint pain.  Skin: Negative for rash.  Neurological: Negative for dizziness, weakness and headaches.     OBJECTIVE:  There were no vitals filed for this visit. There is no height or weight on file to calculate BMI.   Physical Exam Constitutional:      General: She is not in acute distress.    Appearance: Normal appearance. She is not ill-appearing.  HENT:     Head: Normocephalic.  Cardiovascular:     Rate and Rhythm:  Normal rate and regular rhythm.     Pulses: Normal pulses.     Heart sounds: Normal heart sounds. No murmur heard.  No friction rub. No gallop.   Pulmonary:     Effort: Pulmonary effort is normal. No respiratory distress.     Breath sounds: Normal breath sounds. No stridor. No wheezing, rhonchi or rales.  Abdominal:     General: Bowel sounds are normal.     Palpations: Abdomen is soft.     Tenderness: There is no abdominal tenderness.  Musculoskeletal:     Right lower leg: No edema.     Left lower leg: No edema.  Skin:    General: Skin is warm and dry.  Neurological:     Mental Status: She is alert and oriented to person, place, and time.  Psychiatric:        Mood and Affect: Mood normal.        Behavior: Behavior normal.     No results found for this or any previous visit (from the past 24 hour(s)).  No results found.   ASSESSMENT and PLAN  Problem List Items Addressed This Visit    None    Visit Diagnoses    Type 2 diabetes mellitus with hyperglycemia, without long-term current use of insulin (HCC)    -  Primary   Relevant Medications   metFORMIN (GLUCOPHAGE) 1000 MG tablet   lisinopril (ZESTRIL) 5 MG tablet   rosuvastatin (CRESTOR) 10 MG tablet With concerns that the Metformin is causing GI issues She no longer wants to take Metformin 1085m bid. Agreed to Metformin [id number] Will follow up with lab results.   Other Relevant Orders   Hemoglobin A1c   CMP14+EGFR   Microalbumin / creatinine urine ratio   HM Diabetes Foot Exam (Completed)   Dyslipidemia       Relevant Medications   rosuvastatin (CRESTOR) 10 MG tablet Stable on current regimen. Will follow up with lab results.   Other Relevant Orders   Lipid panel   Type 2 diabetes mellitus with complication, without long-term current use of insulin (HCC)       Relevant Medications   metFORMIN (GLUCOPHAGE) 1000 MG tablet   rosuvastatin (CRESTOR) 10 MG tablet    Essential hypertension       Relevant  Medications   lisinopril (ZESTRIL) 5 MG tablet Encouraged to take this medication daily. Continue to monitor on home BP cuff. Goal of < 140/90 Arthritis of Shoulder Voltaren gel as needed for shoulder pain.         Return in about 2 months (around 06/25/2020).   KeHuston Foleyust, FNP-BC [facility] [place] [id number].  [phone]

## 2020-04-25 NOTE — Patient Instructions (Addendum)
Take Metformin 1 tab Daily Take Lisinopirl 5mg  daily. Take your blood pressure daily. Bp< 140/90 voltaren gel for shoulder     Food Choices to Help Relieve Diarrhea, Adult When you have diarrhea, the foods you eat and your eating habits are very important. Choosing the right foods and drinks can help:  Relieve diarrhea.  Replace lost fluids and nutrients.  Prevent dehydration. What general guidelines should I follow?  Relieving diarrhea  Choose foods with less than 2 g or .07 oz. of fiber per serving.  Limit fats to less than 8 tsp (38 g or 1.34 oz.) a day.  Avoid the following: ? Foods and beverages sweetened with high-fructose corn syrup, honey, or sugar alcohols such as xylitol, sorbitol, and mannitol. ? Foods that contain a lot of fat or sugar. ? Fried, greasy, or spicy foods. ? High-fiber grains, breads, and cereals. ? Raw fruits and vegetables.  Eat foods that are rich in probiotics. These foods include dairy products such as yogurt and fermented milk products. They help increase healthy bacteria in the stomach and intestines (gastrointestinal tract, or GI tract).  If you have lactose intolerance, avoid dairy products. These may make your diarrhea worse.  Take medicine to help stop diarrhea (antidiarrheal medicine) only as told by your health care provider. Replacing nutrients  Eat small meals or snacks every 3-4 hours.  Eat bland foods, such as white rice, toast, or baked potato, until your diarrhea starts to get better. Gradually reintroduce nutrient-rich foods as tolerated or as told by your health care provider. This includes: ? Well-cooked protein foods. ? Peeled, seeded, and soft-cooked fruits and vegetables. ? Low-fat dairy products.  Take vitamin and mineral supplements as told by your health care provider. Preventing dehydration  Start by sipping water or a special solution to prevent dehydration (oral rehydration solution, ORS). Urine that is clear or  pale yellow means that you are getting enough fluid.  Try to drink at least 8-10 cups of fluid each day to help replace lost fluids.  You may add other liquids in addition to water, such as clear juice or decaffeinated sports drinks, as tolerated or as told by your health care provider.  Avoid drinks with caffeine, such as coffee, tea, or soft drinks.  Avoid alcohol. What foods are recommended?     The items listed may not be a complete list. Talk with your health care provider about what dietary choices are best for you. Grains White rice. White, [place], or pita breads (fresh or toasted), including plain rolls, buns, or bagels. White pasta. Saltine, soda, or graham crackers. Pretzels. Low-fiber cereal. Cooked cereals made with water (such as cornmeal, farina, or cream cereals). Plain muffins. Matzo. Melba toast. Zwieback. Vegetables Potatoes (without the skin). Most well-cooked and canned vegetables without skins or seeds. Tender lettuce. Fruits Apple sauce. Fruits canned in juice. Cooked apricots, cherries, grapefruit, peaches, pears, or plums. Fresh bananas and cantaloupe. Meats and other protein foods Baked or boiled chicken. Eggs. Tofu. Fish. Seafood. Smooth nut butters. Ground or well-cooked tender beef, ham, veal, lamb, pork, or poultry. Dairy Plain yogurt, kefir, and unsweetened liquid yogurt. Lactose-free milk, buttermilk, skim milk, or soy milk. Low-fat or nonfat hard cheese. Beverages Water. Low-calorie sports drinks. Fruit juices without pulp. Strained tomato and vegetable juices. Decaffeinated teas. Sugar-free beverages not sweetened with sugar alcohols. Oral rehydration solutions, if approved by your health care provider. Seasoning and other foods Bouillon, broth, or soups made from recommended foods. What foods are not recommended? The  items listed may not be a complete list. Talk with your health care provider about what dietary choices are best for you. Grains Whole  grain, whole wheat, bran, or rye breads, rolls, pastas, and crackers. Wild or brown rice. Whole grain or bran cereals. Barley. Oats and oatmeal. Corn tortillas or taco shells. Granola. Popcorn. Vegetables Raw vegetables. Fried vegetables. Cabbage, broccoli, Brussels sprouts, artichokes, baked beans, beet greens, corn, kale, legumes, peas, sweet potatoes, and yams. Potato skins. Cooked spinach and cabbage. Fruits Dried fruit, including raisins and dates. Raw fruits. Stewed or dried prunes. Canned fruits with syrup. Meat and other protein foods Fried or fatty meats. Deli meats. Chunky nut butters. Nuts and seeds. Beans and lentils. Berniece Salines. Hot dogs. Sausage. Dairy High-fat cheeses. Whole milk, chocolate milk, and beverages made with milk, such as milk shakes. Half-and-half. Cream. sour cream. Ice cream. Beverages Caffeinated beverages (such as coffee, tea, soda, or energy drinks). Alcoholic beverages. Fruit juices with pulp. Prune juice. Soft drinks sweetened with high-fructose corn syrup or sugar alcohols. High-calorie sports drinks. Fats and oils Butter. Cream sauces. Margarine. Salad oils. Plain salad dressings. Olives. Avocados. Mayonnaise. Sweets and desserts Sweet rolls, doughnuts, and sweet breads. Sugar-free desserts sweetened with sugar alcohols such as xylitol and sorbitol. Seasoning and other foods Honey. Hot sauce. Chili powder. Gravy. Cream-based or milk-based soups. Pancakes and waffles. Summary  When you have diarrhea, the foods you eat and your eating habits are very important.  Make sure you get at least 8-10 cups of fluid each day, or enough to keep your urine clear or pale yellow.  Eat bland foods and gradually reintroduce healthy, nutrient-rich foods as tolerated, or as told by your health care provider.  Avoid high-fiber, fried, greasy, or spicy foods. This information is not intended to replace advice given to you by your health care provider. Make sure you discuss any  questions you have with your health care provider. Document Revised: 09/30/2018 Document Reviewed: 06/06/2016 Elsevier Patient Education  [employer].     If you have lab work done today you will be contacted with your lab results within the next 2 weeks.  If you have not heard from [place] then please contact us. The fastest way to get your results is to register for My Chart.   IF you received an x-ray today, you will receive an invoice from [facility]. Please contact [facility] at [phone] with questions or concerns regarding your invoice.   IF you received labwork today, you will receive an invoice from [place]. Please contact [facility] at [phone] with questions or concerns regarding your invoice.   Our billing staff will not be able to assist you with questions regarding bills from these companies.  You will be contacted with the lab results as soon as they are available. The fastest way to get your results is to activate your My Chart account. Instructions are located on the last page of this paperwork. If you have not heard from [place] regarding the results in 2 weeks, please contact this office.    Marland Kitchen

## 2020-04-26 LAB — MICROALBUMIN / CREATININE URINE RATIO
Creatinine, Urine: 139.9 mg/dL
Microalb/Creat Ratio: 11 mg/g creat (ref 0–29)
Microalbumin, Urine: 14.9 ug/mL

## 2020-04-26 NOTE — Progress Notes (Signed)
Hello, If you could let Megan Wolfe know her A1c is improved from last visit at 7.5 so she can continue on her Metformin 1000mg  once a day, and we will recheck this at her next appointment. Her Cholesterol remains elevated, we will continue on her current dose of crestor. All other labs look good. Thanks!

## 2020-04-30 ENCOUNTER — Ambulatory Visit (INDEPENDENT_AMBULATORY_CARE_PROVIDER_SITE_OTHER): Payer: Medicare Other | Admitting: Family Medicine

## 2020-04-30 ENCOUNTER — Encounter: Payer: Self-pay | Admitting: Family Medicine

## 2020-04-30 ENCOUNTER — Other Ambulatory Visit: Payer: Self-pay

## 2020-04-30 VITALS — BP 153/79 | HR 71 | Temp 99.7°F | Ht 64.0 in | Wt 124.0 lb

## 2020-04-30 DIAGNOSIS — E785 Hyperlipidemia, unspecified: Secondary | ICD-10-CM

## 2020-04-30 DIAGNOSIS — E118 Type 2 diabetes mellitus with unspecified complications: Secondary | ICD-10-CM

## 2020-04-30 DIAGNOSIS — I10 Essential (primary) hypertension: Secondary | ICD-10-CM | POA: Diagnosis not present

## 2020-04-30 NOTE — Progress Notes (Signed)
11/8/20211:21 PM  Megan Wolfe 1936-05-03, 84 y.o., female [id number]  Chief complaint: Transfer of care.  HPI:   Patient is a 84 y.o. female with past medical history significant for HTN, DM, HLD who presents today for transfer of care  Eye doctor:11/06/18 Dentist: q 6 months Dexa:Declines  Abdominal pain: Resolved Will follow up next appointment Started probiotics  Diabetes Lab Results  Component Value Date   HGBA1C 7.5 (H) 04/25/2020  Patient presents for follow up of diabetes. Symptoms: none. Symptoms have stabilized. Patient denies foot ulcerations, hypoglycemia , increase appetite, nausea, polydipsia and polyuria.  Evaluation to date has been included: hemoglobin A1C.  Home sugars: BGs consistently in an acceptable range. Treatment to date: Continued metformin which has been effective.  Metformin 1000mg  daily She feels like the metformin is causing her GI issues Was previously on glimepride BG has been 120-130 in AM Crestor 10mg  daily Lab Results  Component Value Date   CHOL 238 (H) 04/25/2020   HDL 46 04/25/2020   LDLCALC 170 (H) 04/25/2020   TRIG 123 04/25/2020   CHOLHDL 5.2 (H) 04/25/2020      Hypertension/ HLD Patient here for follow-up of dyslipidemia. She is exercising and is adherent to low salt diet.  Blood pressure is well controlled at home. Cardiac symptoms none. Patient denies chest pain, claudication, exertional chest pressure/discomfort, fatigue, lower extremity edema and near-syncope.  Cardiovascular risk factors: advanced age (older than 34 for men, 73 for women), diabetes mellitus, dyslipidemia and hypertension.  BP Readings from Last 3 Encounters:  04/30/20 (!) 153/79  11/09/19 122/78  09/16/19 122/78  Lisinopril 5 mg Takes BP at home  Depression screen [facility] 2/9 04/30/2020 04/25/2020 11/09/2019  Decreased Interest - 0 0  Down, Depressed, Hopeless 0 0 0  PHQ - 2 Score 0 0 0    Fall Risk  04/30/2020 04/25/2020 11/09/2019 09/16/2019  03/18/2019  Falls in the past year? 0 0 0 0 0  Number falls in past yr: 0 0 0 - 0  Injury with Fall? 0 0 0 - 0  Follow up Falls evaluation completed Falls evaluation completed Falls evaluation completed;Education provided Falls evaluation completed Falls evaluation completed     No Known Allergies  Prior to Admission medications   Medication Sig Start Date End Date Taking? Authorizing Provider  aspirin EC 81 MG tablet Take 324 mg by mouth once.    Yes [provider]  metFORMIN (GLUCOPHAGE) 1000 MG tablet Take 1 tablet (1,000 mg total) by mouth 2 (two) times daily with a meal. 03/18/19  Yes Stallings, Zoe A, MD  rosuvastatin (CRESTOR) 10 MG tablet TAKE 1 TABLET BY MOUTH EVERY OTHER DAY. PLEASE SCHEDULE AN OFFICE VISIT 04/03/19  Yes Stallings, Zoe A, MD  tetrahydrozoline 0.05 % ophthalmic solution Place 1 drop into both eyes as needed. For dry eyes.   Yes [provider]  TRUE METRIX BLOOD GLUCOSE TEST test strip TEST BLOOD GLUCOSE THREE TIMES A DAY. USE AS INSTRUCTED 08/27/19  Yes Forrest Moron, MD    Past Medical History:  Diagnosis Date  . Diabetes mellitus without complication ([place])   . High cholesterol     Past Surgical History:  Procedure Laterality Date  . ABDOMINAL HYSTERECTOMY      Social History   Tobacco Use  . Smoking status: Never Smoker  . Smokeless tobacco: Never Used  Substance Use Topics  . Alcohol use: Yes    Comment: red wine occasional     Family History  Problem  Relation Age of Onset  . Diabetes Mother   . Diabetes Sister   . Breast cancer Neg Hx     Review of Systems  Constitutional: Negative for chills, fever and malaise/fatigue.  Eyes: Negative for blurred vision and double vision.  Respiratory: Negative for cough, shortness of breath and wheezing.   Cardiovascular: Negative for chest pain, palpitations and leg swelling.  Gastrointestinal: Positive for diarrhea (intermittent). Negative for abdominal pain, blood in stool,  constipation, heartburn, nausea and vomiting.  Genitourinary: Negative for dysuria, flank pain, frequency, hematuria and urgency.  Musculoskeletal: Negative for back pain and joint pain.  Skin: Negative for rash.  Neurological: Negative for dizziness, weakness and headaches.     OBJECTIVE:  Today's Vitals   04/30/20 1305  BP: (!) 153/79  Pulse: 71  Temp: 99.7 F (37.6 C)  SpO2: 100%  Weight: 124 lb (56.2 kg)  Height: 5\' 4"  (1.626 m)   Body mass index is 21.28 kg/m.   Physical Exam Constitutional:      General: She is not in acute distress.    Appearance: Normal appearance. She is not ill-appearing.  HENT:     Head: Normocephalic.  Cardiovascular:     Rate and Rhythm: Normal rate and regular rhythm.     Pulses: Normal pulses.     Heart sounds: Normal heart sounds. No murmur heard.  No friction rub. No gallop.   Pulmonary:     Effort: Pulmonary effort is normal. No respiratory distress.     Breath sounds: Normal breath sounds. No stridor. No wheezing, rhonchi or rales.  Abdominal:     General: Bowel sounds are normal.     Palpations: Abdomen is soft.     Tenderness: There is no abdominal tenderness.  Musculoskeletal:     Right lower leg: No edema.     Left lower leg: No edema.  Skin:    General: Skin is warm and dry.  Neurological:     Mental Status: She is alert and oriented to person, place, and time.  Psychiatric:        Mood and Affect: Mood normal.        Behavior: Behavior normal.     No results found for this or any previous visit (from the past 24 hour(s)).  No results found.   ASSESSMENT and PLAN  Problem List Items Addressed This Visit    None    Visit Diagnoses    Type 2 diabetes mellitus with hyperglycemia, without long-term current use of insulin (HCC)    -  Primary   Relevant Medications   metFORMIN (GLUCOPHAGE) 1000 MG tablet   lisinopril (ZESTRIL) 5 MG tablet   rosuvastatin (CRESTOR) 10 MG tablet With concerns that the Metformin is  causing GI issues She no longer wants to take Metformin 1000mg  bid. Agreed to Metformin 1000mg  qd Would like to discuss Medications other than metformin next appt due to GI issues. Started probiotics.   Dyslipidemia       Relevant Medications   rosuvastatin (CRESTOR) 10 MG tablet Stable on current regimen. Will follow up with lab results.   Essential hypertension       Relevant Medications   lisinopril (ZESTRIL) 5 MG tablet Encouraged to take this medication daily. Continue to monitor on home BP cuff. Goal of < 140/90          Return for At January appointment.   Huston Foley Trevonn Hallum, FNP-BC [facility] at [address] [place], [place] [address] Ph.  [phone] Fax [phone]

## 2020-04-30 NOTE — Patient Instructions (Signed)
Probiotics Probiotics are the good bacteria and yeasts that live in your body and keep your digestive system healthy. Probiotics also help your body's defense system (immune system) and protect your body against the growth of harmful bacteria. Your health care provider may recommend taking a probiotic if you are taking antibiotics or have certain medical conditions, such as:  Diarrhea.  Constipation.  Irritable bowel syndrome.  Lung infections.  Yeast infections.  Acne, eczema, and other skin conditions.  Frequent urinary tract infections. What affects the balance of bacteria in my body? The balance of good bacteria in your body can be affected by:  Antibiotic medicines. These medicines treat infections caused by bacteria. Unfortunately, they may kill the good bacteria in your body as well as the bad bacteria.  Certain medical conditions. Conditions related to an imbalance of bacteria include: ? Stomach and intestine (gastrointestinal) infections. ? Lung infections. ? Skin infections. ? Vaginal infections. ? Inflammatory bowel diseases. ? Stomach ulcers (gastric ulcers). ? Tooth decay and gum disease (periodontal disease).  Stress.  Poor diet. What type of probiotic is right for me? Probiotics contain different types of bacteria (strains). Strains commonly found in probiotics include:  Lactobacillus.  Saccharomyces.  Bifidobacterium. Specific strains have been shown to be more effective for certain health conditions. Ask your health care provider which strain or strains you should use and how often. Probiotics come in many different forms, strain combinations, and strengths. Some may need to be refrigerated. Always read the label for storage and usage instructions. Certain foods, such as yogurt, contain probiotics. Probiotics can also be bought as a supplement at a pharmacy, health food store, or grocery store. Talk to your health care provider before starting any  supplement. What are the side effects of probiotics? Some people have side effects when taking probiotics. Side effects are usually temporary and may include:  Gas.  Bloating.  Cramping. Serious side effects are rare. Follow these instructions at home:   If you are taking probiotics with antibiotics: ? Wait at least 2 hours between taking your medicine and the probiotic. ? Eat foods high in fiber, such as whole grains, beans, and vegetables. These foods can help good bacteria grow. ? Avoid certain foods as told by your health care provider. Summary  Probiotics are the good bacteria and yeasts that live in your body and keep you and your digestive system healthy.  Certain foods, such as yogurt, contain probiotics.  Probiotics can be taken as supplements. They can be bought at a pharmacy, health food store, or grocery store. They come in many different forms, strain combinations, and strengths.  Be sure to talk with your health care provider before taking a probiotic supplement. This information is not intended to replace advice given to you by your health care provider. Make sure you discuss any questions you have with your health care provider. Document Revised: 02/26/2018 Document Reviewed: 06/24/2017 Elsevier Patient Education  2020 Elsevier Inc.  

## 2020-05-01 DIAGNOSIS — Z23 Encounter for immunization: Secondary | ICD-10-CM | POA: Diagnosis not present

## 2020-05-14 ENCOUNTER — Telehealth: Payer: Self-pay

## 2020-05-14 NOTE — Telephone Encounter (Signed)
Pt will need OV no visit with just for said issue thank you

## 2020-05-14 NOTE — Telephone Encounter (Signed)
Pt. Called requesting referral from NP Just. Pt. Did no specify where and was only able to identify "stomach issues"

## 2020-05-14 NOTE — Telephone Encounter (Signed)
Called pt and sch appt for 11/29

## 2020-05-21 ENCOUNTER — Other Ambulatory Visit: Payer: Self-pay

## 2020-05-21 ENCOUNTER — Encounter: Payer: Self-pay | Admitting: Family Medicine

## 2020-05-21 ENCOUNTER — Ambulatory Visit (INDEPENDENT_AMBULATORY_CARE_PROVIDER_SITE_OTHER): Payer: Medicare Other | Admitting: Family Medicine

## 2020-05-21 VITALS — BP 122/72 | HR 75 | Temp 98.4°F | Ht 64.0 in | Wt 124.0 lb

## 2020-05-21 DIAGNOSIS — I10 Essential (primary) hypertension: Secondary | ICD-10-CM

## 2020-05-21 DIAGNOSIS — E119 Type 2 diabetes mellitus without complications: Secondary | ICD-10-CM

## 2020-05-21 DIAGNOSIS — R109 Unspecified abdominal pain: Secondary | ICD-10-CM | POA: Diagnosis not present

## 2020-05-21 MED ORDER — EMPAGLIFLOZIN 10 MG PO TABS: 10.0000 mg | ORAL_TABLET | Freq: Every day | ORAL | 3 refills | Status: DC

## 2020-05-21 NOTE — Patient Instructions (Addendum)
Stop metformin Start Jardiance daily Continue crestor daily   Abdominal Pain, Adult Many things can cause belly (abdominal) pain. Most times, belly pain is not dangerous. Many cases of belly pain can be watched and treated at home. Sometimes, though, belly pain is serious. Your doctor will try to find the cause of your belly pain. Follow these instructions at home:  Medicines  Take over-the-counter and prescription medicines only as told by your doctor.  Do not take medicines that help you poop (laxatives) unless told by your doctor. General instructions  Watch your belly pain for any changes.  Drink enough fluid to keep your pee (urine) pale yellow.  Keep all follow-up visits as told by your doctor. This is important. Contact a doctor if:  Your belly pain changes or gets worse.  You are not hungry, or you lose weight without trying.  You are having trouble pooping (constipated) or have watery poop (diarrhea) for more than 2-3 days.  You have pain when you pee or poop.  Your belly pain wakes you up at night.  Your pain gets worse with meals, after eating, or with certain foods.  You are vomiting and cannot keep anything down.  You have a fever.  You have blood in your pee. Get help right away if:  Your pain does not go away as soon as your doctor says it should.  You cannot stop vomiting.  Your pain is only in areas of your belly, such as the right side or the left lower part of the belly.  You have bloody or black poop, or poop that looks like tar.  You have very bad pain, cramping, or bloating in your belly.  You have signs of not having enough fluid or water in your body (dehydration), such as: ? Dark pee, very little pee, or no pee. ? Cracked lips. ? Dry mouth. ? Sunken eyes. ? Sleepiness. ? Weakness.  You have trouble breathing or chest pain. Summary  Many cases of belly pain can be watched and treated at home.  Watch your belly pain for any  changes.  Take over-the-counter and prescription medicines only as told by your doctor.  Contact a doctor if your belly pain changes or gets worse.  Get help right away if you have very bad pain, cramping, or bloating in your belly. This information is not intended to replace advice given to you by your health care provider. Make sure you discuss any questions you have with your health care provider. Document Revised: 10/18/2018 Document Reviewed: 10/18/2018 Elsevier Patient Education  [employer].   If you have lab work done today you will be contacted with your lab results within the next 2 weeks.  If you have not heard from [place] then please contact us. The fastest way to get your results is to register for My Chart.   IF you received an x-ray today, you will receive an invoice from [facility]. Please contact [facility] at [phone] with questions or concerns regarding your invoice.   IF you received labwork today, you will receive an invoice from [place]. Please contact [facility] at [phone] with questions or concerns regarding your invoice.   Our billing staff will not be able to assist you with questions regarding bills from these companies.  You will be contacted with the lab results as soon as they are available. The fastest way to get your results is to activate your My Chart account. Instructions are located on the last page  of this paperwork. If you have not heard from [place] regarding the results in 2 weeks, please contact this office.

## 2020-05-21 NOTE — Progress Notes (Signed)
11/29/202111:34 AM  Megan Wolfe Nov 09, 1935, 84 y.o., female [id number]  Chief Complaint  Patient presents with  . Abdominal Pain    had been exp stomach pain and greasy stool , referral to GI   . discuss medications    metformin side effects     HPI:   Patient is a 84 y.o. female with past medical history significant for HTN, DM, HLD who presents today for abdominal pain.  Was able to see lots of family for Thanksgiving Most of family in [place]  Abdominal pain:  Had Resolved Started probiotics in early November Was having greasy stools Stopped metformin and this improved Now having daily bowel movements She was concerned after looking on the Internet and finding this could be her pancreas   Diabetes Lab Results  Component Value Date   HGBA1C 7.5 (H) 04/25/2020   Patient denies foot ulcerations, hypoglycemia , increase appetite, nausea, polydipsia and polyuria.  Evaluation to date has been included: hemoglobin A1C.  Home sugars: BGs consistently in an acceptable range. Treatment to date: Stopped metformin a week ago. She feels like the metformin is causing her GI issues Was previously on glimepiride years ago. BG has been 120-140 in AM   HLD Crestor 10mg  daily  Lab Results  Component Value Date   CHOL 238 (H) 04/25/2020   HDL 46 04/25/2020   LDLCALC 170 (H) 04/25/2020   TRIG 123 04/25/2020   CHOLHDL 5.2 (H) 04/25/2020    Hypertension  She is exercising and is adherent to low salt diet.  Blood pressure is well controlled at home. Cardiac symptoms none. Patient denies chest pain, claudication, exertional chest pressure/discomfort, fatigue, lower extremity edema and near-syncope.  Cardiovascular risk factors: advanced age (older than 62 for men, 21 for women), diabetes mellitus, dyslipidemia and hypertension.  Takes Lisinopril 5 mg daily Takes BP at home systolic normally in the 283M.  BP Readings from Last 3 Encounters:  05/21/20 122/72  04/30/20 (!)  153/79  11/09/19 122/78   Eye doctor:11/06/18 Dentist: q 6 months Dexa:Declines  Depression screen [facility] 2/9 04/30/2020 04/25/2020 11/09/2019  Decreased Interest - 0 0  Down, Depressed, Hopeless 0 0 0  PHQ - 2 Score 0 0 0    Fall Risk  04/30/2020 04/25/2020 11/09/2019 09/16/2019 03/18/2019  Falls in the past year? 0 0 0 0 0  Number falls in past yr: 0 0 0 - 0  Injury with Fall? 0 0 0 - 0  Follow up Falls evaluation completed Falls evaluation completed Falls evaluation completed;Education provided Falls evaluation completed Falls evaluation completed     No Known Allergies  Prior to Admission medications   Medication Sig Start Date End Date Taking? Authorizing Provider  aspirin EC 81 MG tablet Take 324 mg by mouth once.    Yes [provider]  metFORMIN (GLUCOPHAGE) 1000 MG tablet Take 1 tablet (1,000 mg total) by mouth 2 (two) times daily with a meal. 03/18/19  Yes Stallings, Zoe A, MD  rosuvastatin (CRESTOR) 10 MG tablet TAKE 1 TABLET BY MOUTH EVERY OTHER DAY. PLEASE SCHEDULE AN OFFICE VISIT 04/03/19  Yes Stallings, Zoe A, MD  tetrahydrozoline 0.05 % ophthalmic solution Place 1 drop into both eyes as needed. For dry eyes.   Yes [provider]  TRUE METRIX BLOOD GLUCOSE TEST test strip TEST BLOOD GLUCOSE THREE TIMES A DAY. USE AS INSTRUCTED 08/27/19  Yes Forrest Moron, MD    Past Medical History:  Diagnosis Date  . Diabetes mellitus without  complication ([place])   . High cholesterol     Past Surgical History:  Procedure Laterality Date  . ABDOMINAL HYSTERECTOMY      Social History   Tobacco Use  . Smoking status: Never Smoker  . Smokeless tobacco: Never Used  Substance Use Topics  . Alcohol use: Yes    Comment: red wine occasional     Family History  Problem Relation Age of Onset  . Diabetes Mother   . Diabetes Sister   . Breast cancer Neg Hx     Review of Systems  Constitutional: Negative for chills, fever and malaise/fatigue.  Eyes: Negative for  blurred vision and double vision.  Respiratory: Negative for cough and shortness of breath.   Cardiovascular: Negative for chest pain, palpitations and leg swelling.  Gastrointestinal: Positive for abdominal pain (1 week ago, intermittent) and diarrhea (intermittent 1 week ago, greasy stools). Negative for blood in stool, constipation, heartburn, nausea and vomiting.  Genitourinary: Negative for dysuria, flank pain, frequency, hematuria and urgency.  Musculoskeletal: Negative for back pain and joint pain.  Skin: Negative for rash.  Neurological: Negative for dizziness, weakness and headaches.    OBJECTIVE:  Today's Vitals   05/21/20 1100 05/21/20 1108  BP: (!) 152/80 122/72  Pulse: 75   Temp: 98.4 F (36.9 C)   SpO2: 98%   Weight: 124 lb (56.2 kg)   Height: 5\' 4"  (1.626 m)    Body mass index is 21.28 kg/m.   Physical Exam Constitutional:      General: She is not in acute distress.    Appearance: Normal appearance. She is not ill-appearing.  HENT:     Head: Normocephalic.  Cardiovascular:     Rate and Rhythm: Normal rate and regular rhythm.     Pulses: Normal pulses.     Heart sounds: Normal heart sounds. No murmur heard.  No friction rub. No gallop.   Pulmonary:     Effort: Pulmonary effort is normal. No respiratory distress.     Breath sounds: Normal breath sounds. No stridor. No wheezing, rhonchi or rales.  Abdominal:     General: Abdomen is flat. Bowel sounds are normal.     Palpations: Abdomen is soft. There is no hepatomegaly or mass.     Tenderness: There is no abdominal tenderness.     Hernia: No hernia is present.  Musculoskeletal:     Right lower leg: No edema.     Left lower leg: No edema.  Skin:    General: Skin is warm and dry.  Neurological:     Mental Status: She is alert and oriented to person, place, and time.  Psychiatric:        Mood and Affect: Mood normal.        Behavior: Behavior normal.     No results found for this or any previous  visit (from the past 24 hour(s)).  No results found.   ASSESSMENT and PLAN  Problem List Items Addressed This Visit      Cardiovascular and Mediastinum   Essential hypertension - Primary Recheck BP at goal < 130/80 Continue on Lisinopril 5 mg daily      Endocrine   Type 2 diabetes mellitus without complication, without long-term current use of insulin (HCC)   Relevant Medications   empagliflozin (JARDIANCE) 10 MG TABS tablet daily Stop Metformin Continue to check home BP Continue on Crestor for HLD    Other Visit Diagnoses    Abdominal pain, unspecified abdominal location  Relevant Orders   Amylase   Lipase   US Abdomen Complete Will follow up with lab results.       Return if symptoms worsen or fail to improve, for At next scheduled appointment in January.   Huston Foley Luanna Weesner, FNP-BC Primary Care at [place], [place] [address] Ph.  [phone] Fax [phone]

## 2020-05-22 LAB — LIPASE: Lipase: 101 U/L — ABNORMAL HIGH (ref 14–85)

## 2020-05-22 LAB — AMYLASE: Amylase: 84 U/L (ref 31–110)

## 2020-05-22 NOTE — Progress Notes (Signed)
If you could let Megan Wolfe know her labs look good and we will follow up with the abdominal ultrasound.

## 2020-05-23 ENCOUNTER — Telehealth: Payer: Self-pay | Admitting: Family Medicine

## 2020-05-23 NOTE — Telephone Encounter (Signed)
Pt is calling was supposed to start Jardiance  But the pharmacy is telling pt that that is on hold /    empagliflozin (JARDIANCE) 10 MG TABS tablet [[id number]   [id number] [address], [address] [place] [place] [address]  Phone:  [phone] Fax:  [phone]  DEA #:  [id number]

## 2020-05-24 NOTE — Telephone Encounter (Signed)
Spoke with pharmacy states Jardiance Rx will have a $30 copay , pt is requesting for a different prescription if any other are available. Please advise

## 2020-05-24 NOTE — Telephone Encounter (Signed)
Pt called and stated she would like a call when the new Rx is called in. Pt also stated she doesn't know why she was on this Rx. Please advise.

## 2020-05-25 ENCOUNTER — Other Ambulatory Visit: Payer: Self-pay | Admitting: Family Medicine

## 2020-05-25 DIAGNOSIS — E119 Type 2 diabetes mellitus without complications: Secondary | ICD-10-CM

## 2020-05-25 MED ORDER — DAPAGLIFLOZIN PROPANEDIOL 5 MG PO TABS: 5.0000 mg | ORAL_TABLET | Freq: Every day | ORAL | 3 refills | Status: DC

## 2020-05-25 NOTE — Telephone Encounter (Signed)
If you could let Megan Wolfe know we are using this medication for her diabetes given we had to stop the Metformin due to GI intolerance. Also It is impossible for me to know from my end what her copay will be with each medication. I discontinued the Jardiance and sent in [place] 5mg  to her pharmacy. Hopefully this medication will be cheaper. Thanks.

## 2020-05-25 NOTE — Telephone Encounter (Signed)
Megan Wolfe fyi-Patient was given below msg and  patient stated the pharmacy stated they would have the [place] medication ready for her 05/28/20 since it was not in stock til then. Patient stated she have been taking the metformin once a day till she is able to pick up the new rx Farxiga.

## 2020-05-25 NOTE — Telephone Encounter (Signed)
Called pt and left a message that this was for diabetes a new med was sent in and to call for questions

## 2020-05-30 NOTE — Telephone Encounter (Signed)
Spoke with the patient regarding type 2 medications, she stated she did not like the side effects described for farxiga and would rather take the metformin for now until her follow up with pcp. Informed her provider is is fine with her getting back on metformin for now.

## 2020-05-30 NOTE — Telephone Encounter (Signed)
Pt stated she does not want to take the dapagliflozin propanediol (FARXIGA) 5 MG TABS tablet  That she picked up yesterday due to all of the side effects/ Pt needs provider to call pharmacy and advised that this was the wrong medication so that the pharmacy will take the medication back / pharmacy advised her to have Dr call/ please advise pt when this is done

## 2020-06-06 ENCOUNTER — Ambulatory Visit
Admission: RE | Admit: 2020-06-06 | Discharge: 2020-06-06 | Disposition: A | Discharge status: Home / Self Care | Payer: Medicare Other | Source: Ambulatory Visit | Attending: Family Medicine | Admitting: Family Medicine

## 2020-06-06 DIAGNOSIS — K760 Fatty (change of) liver, not elsewhere classified: Secondary | ICD-10-CM | POA: Diagnosis not present

## 2020-06-06 DIAGNOSIS — N281 Cyst of kidney, acquired: Secondary | ICD-10-CM | POA: Diagnosis not present

## 2020-06-06 DIAGNOSIS — R109 Unspecified abdominal pain: Secondary | ICD-10-CM

## 2020-06-06 DIAGNOSIS — K7689 Other specified diseases of liver: Secondary | ICD-10-CM | POA: Diagnosis not present

## 2020-06-06 NOTE — Progress Notes (Signed)
If you could let Megan Wolfe know her abdominal ultrasound was unremarkable.

## 2020-06-25 ENCOUNTER — Encounter: Payer: Self-pay | Admitting: Family Medicine

## 2020-06-25 ENCOUNTER — Ambulatory Visit (INDEPENDENT_AMBULATORY_CARE_PROVIDER_SITE_OTHER): Payer: Medicare Other | Admitting: Family Medicine

## 2020-06-25 ENCOUNTER — Other Ambulatory Visit: Payer: Self-pay

## 2020-06-25 VITALS — BP 137/76 | HR 98 | Temp 98.2°F | Ht 64.0 in | Wt 122.0 lb

## 2020-06-25 DIAGNOSIS — M25512 Pain in left shoulder: Secondary | ICD-10-CM | POA: Diagnosis not present

## 2020-06-25 DIAGNOSIS — G8929 Other chronic pain: Secondary | ICD-10-CM | POA: Diagnosis not present

## 2020-06-25 DIAGNOSIS — E119 Type 2 diabetes mellitus without complications: Secondary | ICD-10-CM | POA: Diagnosis not present

## 2020-06-25 MED ORDER — DICLOFENAC SODIUM 1 % EX GEL: 2.0000 g | Freq: Four times a day (QID) | CUTANEOUS | 3 refills | Status: DC

## 2020-06-25 MED ORDER — EMPAGLIFLOZIN 10 MG PO TABS: 10.0000 mg | ORAL_TABLET | Freq: Every day | ORAL | 3 refills | Status: DC

## 2020-06-25 NOTE — Patient Instructions (Addendum)
Shoulder Range of Motion Exercises Shoulder range of motion (ROM) exercises are done to keep the shoulder moving freely or to increase movement. They are often recommended for people who have shoulder pain or stiffness or who are recovering from a shoulder surgery. Phase 1 exercises When you are able, do this exercise 1-2 times per day for 30-60 seconds in each direction, or as directed by your health care provider. Pendulum exercise To do this exercise while sitting: 1. Sit in a chair or at the edge of your bed with your feet flat on the floor. 2. Let your affected arm hang down in front of you over the edge of the bed or chair. 3. Relax your shoulder, arm, and hand. 4. Rock your body so your arm gently swings in small circles. You can also use your unaffected arm to start the motion. 5. Repeat changing the direction of the circles, swinging your arm left and right, and swinging your arm forward and back. To do this exercise while standing: 1. Stand next to a sturdy chair or table, and hold on to it with your hand on your unaffected side. 2. Bend forward at the waist. 3. Bend your knees slightly. 4. Relax your shoulder, arm, and hand. 5. While keeping your shoulder relaxed, use body motion to swing your arm in small circles. 6. Repeat changing the direction of the circles, swinging your arm left and right, and swinging your arm forward and back. 7. Between exercises, stand up tall and take a short break to relax your lower back.  Phase 2 exercises Do these exercises 1-2 times per day or as told by your health care provider. Hold each stretch for 30 seconds, and repeat 3 times. Do the exercises with one or both arms as instructed by your health care provider. For these exercises, sit at a table with your hand and arm supported by the table. A chair that slides easily or has wheels can be helpful. External rotation 1. Turn your chair so that your affected side is nearest to the  table. 2. Place your forearm on the table to your side. Bend your elbow about 90 at the elbow (right angle) and place your hand palm facing down on the table. Your elbow should be about 6 inches away from your side. 3. Keeping your arm on the table, lean your body forward. Abduction 1. Turn your chair so that your affected side is nearest to the table. 2. Place your forearm and hand on the table so that your thumb points toward the ceiling and your arm is straight out to your side. 3. Slide your hand out to the side and away from you, using your unaffected arm to do the work. 4. To increase the stretch, you can slide your chair away from the table. Flexion: forward stretch 1. Sit facing the table. Place your hand and elbow on the table in front of you. 2. Slide your hand forward and away from you, using your unaffected arm to do the work. 3. To increase the stretch, you can slide your chair backward. Phase 3 exercises Do these exercises 1-2 times per day or as told by your health care provider. Hold each stretch for 30 seconds, and repeat 3 times. Do the exercises with one or both arms as instructed by your health care provider. Cross-body stretch: posterior capsule stretch 1. Lift your arm straight out in front of you. 2. Bend your arm 90 at the elbow (right angle) so your  forearm moves across your body. 3. Use your other arm to gently pull the elbow across your body, toward your other shoulder. Wall climbs 1. Stand with your affected arm extended out to the side with your hand resting on a door frame. 2. Slide your hand slowly up the door frame. 3. To increase the stretch, step through the door frame. Keep your body upright and do not lean. Wand exercises You will need a cane, a piece of PVC pipe, or a sturdy wooden dowel for wand exercises. Flexion To do this exercise while standing: 1. Hold the wand with both of your hands, palms down. 2. Using the other arm to help, lift your arms  up and over your head, if able. 3. Push upward with your other arm to gently increase the stretch. To do this exercise while lying down: 1. Lie on your back with your elbows resting on the floor and the wand in both your hands. Your hands will be palm down, or pointing toward your feet. 2. Lift your hands toward the ceiling, using your unaffected arm to help if needed. 3. Bring your arms overhead as able, using your unaffected arm to help if needed. Internal rotation 1. Stand while holding the wand behind you with both hands. Your unaffected arm should be extended above your head with the arm of the affected side extended behind you at the level of your waist. The wand should be pointing straight up and down as you hold it. 2. Slowly pull the wand up behind your back by straightening the elbow of your unaffected arm and bending the elbow of your affected arm. External rotation 1. Lie on your back with your affected upper arm supported on a small pillow or rolled towel. When you first do this exercise, keep your upper arm close to your body. Over time, bring your arm up to a 90 angle out to the side. 2. Hold the wand across your stomach and with both hands palm up. Your elbow on your affected side should be bent at a 90 angle. 3. Use your unaffected side to help push your forearm away from you and toward the floor. Keep your elbow on your affected side bent at a 90 angle. Contact a health care provider if you have:  New or increasing pain.  New numbness, tingling, weakness, or discoloration in your arm or hand. This information is not intended to replace advice given to you by your health care provider. Make sure you discuss any questions you have with your health care provider. Document Revised: 07/22/2017 Document Reviewed: 07/22/2017 Elsevier Patient Education  [employer].  Shoulder Pain Many things can cause shoulder pain, including:  An injury.  Moving the shoulder in the  same way again and again (overuse).  Joint pain (arthritis). Pain can come from:  Swelling and irritation (inflammation) of any part of the shoulder.  An injury to the shoulder joint.  An injury to: ? Tissues that connect muscle to bone (tendons). ? Tissues that connect bones to each other (ligaments). ? Bones. Follow these instructions at home: Watch for changes in your symptoms. Let your doctor know about them. Follow these instructions to help with your pain. If you have a sling:  Wear the sling as told by your doctor. Remove it only as told by your doctor.  Loosen the sling if your fingers: ? Tingle. ? Become numb. ? Turn cold and blue.  Keep the sling clean.  If the sling is  not waterproof: ? Do not let it get wet. ? Take the sling off when you shower or bathe. Managing pain, stiffness, and swelling   If told, put ice on the painful area: ? Put ice in a plastic bag. ? Place a towel between your skin and the bag. ? Leave the ice on for 20 minutes, 2-3 times a day. Stop putting ice on if it does not help with the pain.  Squeeze a soft ball or a foam pad as much as possible. This prevents swelling in the shoulder. It also helps to strengthen the arm. General instructions  Take over-the-counter and prescription medicines only as told by your doctor.  Keep all follow-up visits as told by your doctor. This is important. Contact a doctor if:  Your pain gets worse.  Medicine does not help your pain.  You have new pain in your arm, hand, or fingers. Get help right away if:  Your arm, hand, or fingers: ? Tingle. ? Are numb. ? Are swollen. ? Are painful. ? Turn white or blue. Summary  Shoulder pain can be caused by many things. These include injury, moving the shoulder in the same away again and again, and joint pain.  Watch for changes in your symptoms. Let your doctor know about them.  This condition may be treated with a sling, ice, and pain  medicine.  Contact your doctor if the pain gets worse or you have new pain. Get help right away if your arm, hand, or fingers tingle or get numb, swollen, or painful.  Keep all follow-up visits as told by your doctor. This is important. This information is not intended to replace advice given to you by your health care provider. Make sure you discuss any questions you have with your health care provider. Document Revised: 12/22/2017 Document Reviewed: 12/22/2017 Elsevier Patient Education  [employer].  If you have lab work done today you will be contacted with your lab results within the next 2 weeks.  If you have not heard from [place] then please contact us. The fastest way to get your results is to register for My Chart.   IF you received an x-ray today, you will receive an invoice from [facility]. Please contact [facility] at [phone] with questions or concerns regarding your invoice.   IF you received labwork today, you will receive an invoice from [facility]. Please contact [facility] at [phone] with questions or concerns regarding your invoice.   Our billing staff will not be able to assist you with questions regarding bills from these companies.  You will be contacted with the lab results as soon as they are available. The fastest way to get your results is to activate your My Chart account. Instructions are located on the last page of this paperwork. If you have not heard from [place] regarding the results in 2 weeks, please contact this office.

## 2020-06-25 NOTE — Progress Notes (Signed)
1/3/202211:41 AM  Guido Sander [birth year], 85 y.o., female [id number]  Chief Complaint  Patient presents with  . left shoulder pain     For 6 to 7 months / arthritis   . Diabetes    Follow up, patient reports GI side effects, jardiance and farxiga are expesive but would like to try them again . She prefers jardiance to be sent in to run by her medical insurance     HPI:   Patient is a 85 y.o. female with past medical history significant for DM, HTN who presents today for DM follow up and left shoulder pain.  Diabetes Currently taking Metformin Still having GI side effects with this medication Has tried [place] Gave, Farxiga Has had issues with cost of medication Home BG in the am 120s Denies episodes of hypoglycemia Would like to go back to [employer] to work on [employer] Value Date   HGBA1C 7.5 (H) 04/25/2020   Left Shoulder pain Issues with arthritis Full ROM, pain with ROM Achy at rest This is the same arm she uses her phone with Denies trauma, numbness, tingling, point tenderness Does not like to take oral pain medications   Depression screen [facility] 2/9 06/25/2020 04/30/2020 04/25/2020  Decreased Interest 0 - 0  Down, Depressed, Hopeless 0 0 0  PHQ - 2 Score 0 0 0    Fall Risk  06/25/2020 04/30/2020 04/25/2020 11/09/2019 09/16/2019  Falls in the past year? 0 0 0 0 0  Number falls in past yr: 0 0 0 0 -  Injury with Fall? 0 0 0 0 -  Follow up Falls evaluation completed Falls evaluation completed Falls evaluation completed Falls evaluation completed;Education provided Falls evaluation completed     No Known Allergies  Prior to Admission medications   Medication Sig Start Date End Date Taking? Authorizing Provider  Accu-Chek Softclix Lancets lancets SMARTSIG:Topical 02/28/20   [provider]  aspirin EC 81 MG tablet Take 324 mg by mouth once.  Patient not taking: Reported on 05/21/2020    [provider]   Blood Glucose Monitoring Suppl (ACCU-CHEK GUIDE ME) w/Device KIT  02/28/20   [provider]  dapagliflozin propanediol (FARXIGA) 5 MG TABS tablet Take 1 tablet (5 mg total) by mouth daily before breakfast. 05/25/20   Daila Elbert, Laurita Quint, FNP  lisinopril (ZESTRIL) 5 MG tablet Take 1 tablet (5 mg total) by mouth daily. 04/25/20   Diana Armijo, Laurita Quint, FNP  rosuvastatin (CRESTOR) 10 MG tablet TAKE 1 TABLET BY MOUTH EVERY OTHER DAY. PLEASE SCHEDULE AN OFFICE VISIT 04/25/20   Michalina Calbert, Laurita Quint, FNP  tetrahydrozoline 0.05 % ophthalmic solution Place 1 drop into both eyes as needed. For dry eyes. Patient not taking: Reported on 05/21/2020    [provider]  TRUE METRIX BLOOD GLUCOSE TEST test strip TEST BLOOD GLUCOSE THREE TIMES A DAY. USE AS INSTRUCTED 08/27/19   Forrest Moron, MD    Past Medical History:  Diagnosis Date  . Diabetes mellitus without complication ([place])   . High cholesterol     Past Surgical History:  Procedure Laterality Date  . ABDOMINAL HYSTERECTOMY      Social History   Tobacco Use  . Smoking status: Never Smoker  . Smokeless tobacco: Never Used  Substance Use Topics  . Alcohol use: Yes    Comment: red wine occasional     Family History  Problem Relation Age of Onset  . Diabetes Mother   . Diabetes Sister   .  Breast cancer Neg Hx     Review of Systems  Constitutional: Negative for chills, fever and malaise/fatigue.  Eyes: Negative for blurred vision and double vision.  Respiratory: Negative for cough, shortness of breath and wheezing.   Cardiovascular: Negative for chest pain, palpitations and leg swelling.  Gastrointestinal: Negative for abdominal pain, blood in stool, constipation, diarrhea, heartburn, nausea and vomiting.  Genitourinary: Negative for dysuria, frequency and hematuria.  Musculoskeletal: Positive for joint pain (Left shoulder pain). Negative for back pain.  Skin: Negative for rash.  Neurological: Negative for dizziness, tingling, sensory  change, weakness and headaches.    OBJECTIVE:  Today's Vitals   06/25/20 1105  BP: 137/76  Pulse: 98  Temp: 98.2 F (36.8 C)  SpO2: 98%  Weight: 122 lb (55.3 kg)  Height: '5\' 4"'  (1.626 m)   Body mass index is 20.94 kg/m.   Physical Exam Constitutional:      General: She is not in acute distress.    Appearance: Normal appearance. She is not ill-appearing.  HENT:     Head: Normocephalic.  Cardiovascular:     Rate and Rhythm: Normal rate and regular rhythm.     Pulses: Normal pulses.     Heart sounds: Normal heart sounds. No murmur heard. No friction rub. No gallop.   Pulmonary:     Effort: Pulmonary effort is normal. No respiratory distress.     Breath sounds: Normal breath sounds. No stridor. No wheezing, rhonchi or rales.  Abdominal:     General: Bowel sounds are normal.     Palpations: Abdomen is soft.     Tenderness: There is no abdominal tenderness.  Musculoskeletal:     Right shoulder: Normal.     Left shoulder: Normal.     Cervical back: Normal.     Right lower leg: No edema.     Left lower leg: No edema.  Skin:    General: Skin is warm and dry.  Neurological:     Mental Status: She is alert and oriented to person, place, and time.  Psychiatric:        Mood and Affect: Mood normal.        Behavior: Behavior normal.     No results found for this or any previous visit (from the past 24 hour(s)).  No results found.   ASSESSMENT and PLAN  Problem List Items Addressed This Visit      Endocrine   Type 2 diabetes mellitus without complication, without long-term current use of insulin (HCC)   Relevant Medications   empagliflozin (JARDIANCE) 10 MG TABS tablet  R/se/b of medication discussed Will follow up with labs next visit    Other Visit Diagnoses    Chronic left shoulder pain    -  Primary   Relevant Medications   diclofenac Sodium (VOLTAREN) 1 % GEL   Other Relevant Orders   Ambulatory referral to Physical Therapy  RTC precautions  provided Discussed the use of OTC pain medications Discussed conservative treatments and the use of alternative medicine (chiropractor, massage), heat, ice      Return in about 2 months (around 08/23/2020).   Huston Foley Meeah Totino, FNP-BC [facility] at [place] [facility] [place], [place] [address] Ph.  [phone] Fax [phone]

## 2020-07-10 ENCOUNTER — Other Ambulatory Visit: Payer: Self-pay | Admitting: Family Medicine

## 2020-07-10 DIAGNOSIS — E119 Type 2 diabetes mellitus without complications: Secondary | ICD-10-CM

## 2020-07-10 MED ORDER — EMPAGLIFLOZIN 10 MG PO TABS: 10.0000 mg | ORAL_TABLET | Freq: Every day | ORAL | 0 refills | Status: DC

## 2020-07-10 NOTE — Telephone Encounter (Signed)
Medication: empagliflozin (JARDIANCE) 10 MG TABS tablet Has the pt contacted their pharmacy? Yes Pt needs this changed to a 90 day supply and not 30 w/ 3 refills. The price is less for her with a 90 day supply Preferred pharmacy:  [id number] - [place], [place].  Please be advised refills may take up to 3 business days.  We ask that you follow up with your pharmacy.

## 2020-07-18 ENCOUNTER — Ambulatory Visit: Payer: Medicare Other | Attending: Family Medicine | Admitting: Physical Therapy

## 2020-07-18 ENCOUNTER — Other Ambulatory Visit: Payer: Self-pay

## 2020-07-18 ENCOUNTER — Encounter: Payer: Self-pay | Admitting: Physical Therapy

## 2020-07-18 DIAGNOSIS — R293 Abnormal posture: Secondary | ICD-10-CM | POA: Diagnosis not present

## 2020-07-18 DIAGNOSIS — M25612 Stiffness of left shoulder, not elsewhere classified: Secondary | ICD-10-CM

## 2020-07-18 DIAGNOSIS — M25512 Pain in left shoulder: Secondary | ICD-10-CM | POA: Diagnosis not present

## 2020-07-18 DIAGNOSIS — M6281 Muscle weakness (generalized): Secondary | ICD-10-CM

## 2020-07-18 DIAGNOSIS — G8929 Other chronic pain: Secondary | ICD-10-CM | POA: Diagnosis not present

## 2020-07-18 NOTE — Patient Instructions (Signed)
Access Code: [email or web address] URL: [email or web address] Date: 07/18/2020 Prepared by: Hilda Blades  Exercises Supine Shoulder Flexion Extension AAROM with Dowel - 1-2 x daily - 7 x weekly - 10 reps - 5 second hold Seated Scapular Retraction - 3 x daily - 7 x weekly - 10 reps - 5 seconds hold Shoulder External Rotation and Scapular Retraction with Resistance - 1-2 x daily - 7 x weekly - 2 sets - 20 reps

## 2020-07-18 NOTE — Therapy (Signed)
[place], [place], [address] Phone: [phone]   Fax:  [phone]  Physical Therapy Evaluation  Patient Details  Name: Megan Wolfe MRN: [id number] Date of Birth: 07-17-1935 Referring Provider (PT): Just, Laurita Quint, FNP   Encounter Date: 07/18/2020   PT End of Session - 07/18/20 1233    Visit Number 1    Number of Visits 6    Date for PT Re-Evaluation 08/29/20    Authorization Type MCR    Authorization Time Period FOTO by 6th visit, KX by 15th    Progress Note Due on Visit 10    PT Start Time 1217    PT Stop Time 1300    PT Time Calculation (min) 43 min    Activity Tolerance Patient tolerated treatment well    Behavior During Therapy [facility] for tasks assessed/performed           Past Medical History:  Diagnosis Date  . Diabetes mellitus without complication ([place])   . High cholesterol     Past Surgical History:  Procedure Laterality Date  . ABDOMINAL HYSTERECTOMY      There were no vitals filed for this visit.    Subjective Assessment - 07/18/20 1217    Subjective Patient reports left shoulder pain that started above the shoulder and is now more in the upper arm. No mechanism of injury noted. Started about 6-7 months ago, maybe a little longer. Pain occurs when she moves it, especially when reaching up and to the side. Patient reports that her arm will hurt when she is holding her cellphone in left hand for extended periods.    Limitations House hold activities;Lifting    How long can you sit comfortably? No limitation    Diagnostic tests X-ray    Patient Stated Goals Improve shoulder pain    Currently in Pain? Yes    Pain Score 0-No pain   3-10/10 when moving arm   Pain Location Shoulder    Pain Orientation Left    Pain Descriptors / Indicators Sharp    Pain Type Chronic pain    Pain Onset More than a month ago    Pain Frequency Intermittent    Aggravating Factors  Raising arm up     Pain Relieving Factors Rest, creams and patches              [facility] PT Assessment - 07/18/20 0001      Assessment   Medical Diagnosis Chronic left shoulder pain    Referring Provider (PT) Just, Laurita Quint, FNP    Onset Date/Surgical Date --   6-7 month history   Hand Dominance Right    Next MD Visit 08/23/2020    Prior Therapy None      Precautions   Precautions None      Restrictions   Weight Bearing Restrictions No      Balance Screen   Has the patient fallen in the past 6 months No    Has the patient had a decrease in activity level because of a fear of falling?  No    Is the patient reluctant to leave their home because of a fear of falling?  No      Prior Function   Level of Independence Independent    Vocation Retired      Associate Professor   Overall Cognitive Status Within Functional Limits for tasks assessed      Observation/Other Assessments   Observations Patient appears in no  apparent distress    Focus on Therapeutic Outcomes (FOTO)  36% functional status      Coordination   Gross Motor Movements are Fluid and Coordinated Yes      Posture/Postural Control   Posture Comments Patient exhibits rounded shoulder and forward head posturing      ROM / Strength   AROM / PROM / Strength AROM;PROM;Strength      AROM   Overall AROM Comments Cervical ROM grossly WFL, left upper trap pain with left rotation    AROM Assessment Site Shoulder    Right/Left Shoulder Right;Left    Right Shoulder Flexion 145 Degrees    Right Shoulder External Rotation 70 Degrees    Left Shoulder Flexion 130 Degrees    Left Shoulder External Rotation 60 Degrees      PROM   PROM Assessment Site Shoulder    Right/Left Shoulder Right;Left    Left Shoulder Flexion 140 Degrees    Left Shoulder Internal Rotation 35 Degrees    Left Shoulder External Rotation 90 Degrees      Strength   Strength Assessment Site Shoulder    Right/Left Shoulder Right;Left    Right Shoulder Flexion 4+/5    Right  Shoulder ABduction 4+/5    Right Shoulder Internal Rotation 5/5    Right Shoulder External Rotation 4+/5    Left Shoulder Flexion 4/5    Left Shoulder ABduction 4-/5   lateral shoulder pain   Left Shoulder Internal Rotation 5/5    Left Shoulder External Rotation 4/5      Palpation   Palpation comment TTP left upper trap region, left GHJ hypomobile posterior and inferior                      Objective measurements completed on examination: See above findings.       [place] Adult PT Treatment/Exercise - 07/18/20 0001      Exercises   Exercises Shoulder      Shoulder Exercises: Supine   Flexion 10 reps    Flexion Limitations AAROM with dowel      Shoulder Exercises: Seated   Retraction 10 reps   5 sec hold   Retraction Limitations shoulder blade squeezes    External Rotation 20 reps    Theraband Level (Shoulder External Rotation) Level 1 (Yellow)    External Rotation Limitations double er+scap retraction                  PT Education - 07/18/20 1232    Education Details Exam findings, POC, HEP, posture    Person(s) Educated Patient    Methods Explanation;Demonstration;Tactile cues;Verbal cues;Handout    Comprehension Verbalized understanding;Returned demonstration;Verbal cues required;Tactile cues required;Need further instruction            PT Short Term Goals - 07/18/20 1233      PT SHORT TERM GOAL #1   Title Patient will be I with initial HEP to progress with PT    Time 3    Period Weeks    Status New    Target Date 08/08/20      PT SHORT TERM GOAL #2   Title Patient will demonstrate appropriate seated posture and methods to use phone with </= 3/10 pain level    Time 3    Period Weeks    Status New    Target Date 08/08/20      PT SHORT TERM GOAL #3   Title PT will review FOTO with patient by 3rd visit  to understand anticipated improvement    Time 3    Period Weeks    Status New    Target Date 08/08/20             PT Long Term  Goals - 07/18/20 1234      PT LONG TERM GOAL #1   Title Patient will be I with final HEP o maintain progress from PT    Time 6    Period Weeks    Status New    Target Date 08/29/20      PT LONG TERM GOAL #2   Title Patient will exhibit left shoulder elevation AROM >/= 140 deg to improve reaching to cabinets    Time 6    Period Weeks    Status New    Target Date 08/29/20      PT LONG TERM GOAL #3   Title Patient will be able to reach behind her to turn off bedside light with </= 2/10 pain level to reduce functional limitations    Time 6    Period Weeks    Status New    Target Date 08/29/20      PT LONG TERM GOAL #4   Title Patient will exhibit left shoulder flexion and external rotator strength grossly >/= 4+/5 MMT to improve tolerance for holding phone    Time 6    Period Weeks    Status New    Target Date 08/29/20      PT LONG TERM GOAL #5   Title Patient will report improved functional status >/= 58% on FOTO    Time 6    Period Weeks    Status New    Target Date 08/29/20                  Plan - 07/18/20 1320    Clinical Impression Statement Patient presents to PT with report of chronic left shoulder pain that began around the left upper trap region and has progressed to lateral upper arm region. She does exhibit limitations with active and passive motion and pain noted mainly when raising out to the side or reaching behind her, rotator cuff strength deficit, postural impairment, and tenderness of left upper trap region. Her symptoms seem most consistent with rotator cuff pain. She was provided exercises to work on postural control and shoulder motion. Patient would benefit from continued skilled PT to progress her mobility and strength to reduce pain and improve reaching ability with left arm.    Personal Factors and Comorbidities Age;Time since onset of injury/illness/exacerbation    Examination-Activity Limitations Reach Overhead;Lift;Carry     Examination-Participation Restrictions Cleaning;Other   Using cellphone   Stability/Clinical Decision Making Stable/Uncomplicated    Clinical Decision Making Low    Rehab Potential Good    PT Frequency 1x / week    PT Duration 6 weeks    PT Treatment/Interventions ADLs/Self Care Home Management;Cryotherapy;Electrical Stimulation;Iontophoresis 4mg /ml Dexamethasone;Moist Heat;Neuromuscular re-education;Therapeutic exercise;Therapeutic activities;Functional mobility training;Patient/family education;Manual techniques;Dry needling;Passive range of motion;Taping;Spinal Manipulations;Joint Manipulations    PT Next Visit Plan Review HEP and progress PRN, manual for left shoulder mobility, progress rotator cuff and postural strengthening    PT Home Exercise Plan [email or web address]    Consulted and Agree with Plan of Care Patient           Patient will benefit from skilled therapeutic intervention in order to improve the following deficits and impairments:  Postural dysfunction,Decreased strength,Pain,Decreased range of motion,Decreased activity tolerance  Visit Diagnosis: Chronic left  shoulder pain  Stiffness of left shoulder, not elsewhere classified  Muscle weakness (generalized)  Abnormal posture     Problem List Patient Active Problem List   Diagnosis Date Noted  . Essential hypertension 05/21/2020  . Pseudophakia 11/04/2016  . Type 2 diabetes mellitus without complication, without long-term current use of insulin ([place]) 10/22/2016  . Urticaria 10/22/2016    Hilda Blades, PT, DPT, LAT, ATC 07/18/20  1:39 PM Phone: [phone] Fax: [facility] [address] [place], [place], [address] Phone: [phone]   Fax:  [phone]  Name: Megan Wolfe MRN: [id number] Date of Birth: 07-May-1936

## 2020-08-01 ENCOUNTER — Ambulatory Visit: Payer: Medicare Other | Admitting: Rehabilitative and Restorative Service Providers"

## 2020-08-08 ENCOUNTER — Ambulatory Visit: Payer: Medicare Other | Attending: Family Medicine | Admitting: Physical Therapy

## 2020-08-08 ENCOUNTER — Encounter: Payer: Self-pay | Admitting: Physical Therapy

## 2020-08-08 ENCOUNTER — Other Ambulatory Visit: Payer: Self-pay

## 2020-08-08 DIAGNOSIS — M25612 Stiffness of left shoulder, not elsewhere classified: Secondary | ICD-10-CM | POA: Diagnosis not present

## 2020-08-08 DIAGNOSIS — G8929 Other chronic pain: Secondary | ICD-10-CM | POA: Insufficient documentation

## 2020-08-08 DIAGNOSIS — M25512 Pain in left shoulder: Secondary | ICD-10-CM | POA: Insufficient documentation

## 2020-08-08 DIAGNOSIS — R293 Abnormal posture: Secondary | ICD-10-CM | POA: Insufficient documentation

## 2020-08-08 DIAGNOSIS — M6281 Muscle weakness (generalized): Secondary | ICD-10-CM | POA: Diagnosis not present

## 2020-08-08 NOTE — Therapy (Signed)
[place], [place], [address] Phone: [phone]   Fax:  [phone]  Physical Therapy Treatment  Patient Details  Name: Megan Wolfe MRN: [id number] Date of Birth: Apr 29, 1936 Referring Provider (PT): Just, Laurita Quint, FNP   Encounter Date: 08/08/2020   PT End of Session - 08/08/20 1213    Visit Number 2    Number of Visits 6    Date for PT Re-Evaluation 08/29/20    Authorization Type MCR    Authorization Time Period FOTO by 6th visit, KX by 15th    Progress Note Due on Visit 10    PT Start Time 1219    PT Stop Time 1300    PT Time Calculation (min) 41 min    Activity Tolerance Patient tolerated treatment well    Behavior During Therapy [facility] for tasks assessed/performed           Past Medical History:  Diagnosis Date  . Diabetes mellitus without complication ([place])   . High cholesterol     Past Surgical History:  Procedure Laterality Date  . ABDOMINAL HYSTERECTOMY      There were no vitals filed for this visit.   Subjective Assessment - 08/08/20 1220    Subjective Patient reports she is doing well. She feels her shoulder pain is improving but still feels pain mainly at night when she relaxes.    Patient Stated Goals Improve shoulder pain    Currently in Pain? No/denies    Pain Location Shoulder    Pain Orientation Left    Pain Type Chronic pain              [facility] PT Assessment - 08/08/20 0001      AROM   Left Shoulder Flexion 138 Degrees      PROM   Overall PROM Comments PROM grossly WFL and equal bilaterally      Strength   Left Shoulder ABduction 4-/5                         [facility] Adult PT Treatment/Exercise - 08/08/20 0001      Exercises   Exercises Shoulder      Shoulder Exercises: Supine   Horizontal ABduction 10 reps   2 sets   Theraband Level (Shoulder Horizontal ABduction) Level 1 (Yellow)    Horizontal ABduction Limitations consistent cueing  for technique, keeping arms straight and control    Flexion 10 reps   2 sets   Flexion Limitations straight arm with dowel, cueing for controlled movement    Diagonals 10 reps   2 sets   Theraband Level (Shoulder Diagonals) Level 1 (Yellow)    Diagonals Limitations left D2 flexion, cueing for technique and control, moving through comfortable range      Shoulder Exercises: Seated   Row 10 reps   2 sets   Theraband Level (Shoulder Row) Level 1 (Yellow)    Row Limitations cueing to avoid shoulder shrug    External Rotation 10 reps   2 sets   Theraband Level (Shoulder External Rotation) Level 1 (Yellow)    External Rotation Limitations double er+scap retraction, cueing for technique and avoid shoulder shrug      Shoulder Exercises: ROM/Strengthening   UBE (Upper Arm Bike) L1 x 4 min (2 fwd/bwd)      Manual Therapy   Manual Therapy Joint mobilization;Passive ROM    Joint Mobilization Left GHJ inferior and posterior in various ranges of  elevation grade III-IV    Passive ROM Left shoulder all directions                  PT Education - 08/08/20 1213    Education Details HEP update    Person(s) Educated Patient    Methods Explanation;Demonstration;Verbal cues;Handout    Comprehension Verbalized understanding;Need further instruction;Returned demonstration;Verbal cues required            PT Short Term Goals - 08/08/20 1316      PT SHORT TERM GOAL #1   Title Patient will be I with initial HEP to progress with PT    Time 3    Period Weeks    Status On-going    Target Date 08/08/20      PT SHORT TERM GOAL #2   Title Patient will demonstrate appropriate seated posture and methods to use phone with </= 3/10 pain level    Baseline Patient reports minimal pain while using phone, has tried supporting phone on pillow    Time 3    Period Weeks    Status Achieved    Target Date 08/08/20      PT SHORT TERM GOAL #3   Title PT will review FOTO with patient by 3rd visit to  understand anticipated improvement    Time 3    Period Weeks    Status On-going    Target Date 08/08/20             PT Long Term Goals - 07/18/20 1234      PT LONG TERM GOAL #1   Title Patient will be I with final HEP o maintain progress from PT    Time 6    Period Weeks    Status New    Target Date 08/29/20      PT LONG TERM GOAL #2   Title Patient will exhibit left shoulder elevation AROM >/= 140 deg to improve reaching to cabinets    Time 6    Period Weeks    Status New    Target Date 08/29/20      PT LONG TERM GOAL #3   Title Patient will be able to reach behind her to turn off bedside light with </= 2/10 pain level to reduce functional limitations    Time 6    Period Weeks    Status New    Target Date 08/29/20      PT LONG TERM GOAL #4   Title Patient will exhibit left shoulder flexion and external rotator strength grossly >/= 4+/5 MMT to improve tolerance for holding phone    Time 6    Period Weeks    Status New    Target Date 08/29/20      PT LONG TERM GOAL #5   Title Patient will report improved functional status >/= 58% on FOTO    Time 6    Period Weeks    Status New    Target Date 08/29/20                 Plan - 08/08/20 1214    Clinical Impression Statement Patient tolerated therapy well with no adverse effects. She exhibits good passive motion this visit but continues to exhibit slight deficit with active elevation and strength of rotator cuff. Therapy focused primarily on strengthening the left shoulder. She does require consistent cueing for exercise technique and avoid shrug on left. HEP update to progress shoulder strength with good tolerance. Patient would benefit from continued  skilled PT to progress her mobility and strength to reduce pain and improve reaching ability with left arm.    PT Treatment/Interventions ADLs/Self Care Home Management;Cryotherapy;Electrical Stimulation;Iontophoresis 4mg /ml Dexamethasone;Moist Heat;Neuromuscular  re-education;Therapeutic exercise;Therapeutic activities;Functional mobility training;Patient/family education;Manual techniques;Dry needling;Passive range of motion;Taping;Spinal Manipulations;Joint Manipulations    PT Next Visit Plan Review HEP and progress PRN, manual for left shoulder mobility, progress rotator cuff and postural strengthening    PT Home Exercise Plan [email or web address]    Consulted and Agree with Plan of Care Patient           Patient will benefit from skilled therapeutic intervention in order to improve the following deficits and impairments:  Postural dysfunction,Decreased strength,Pain,Decreased range of motion,Decreased activity tolerance  Visit Diagnosis: Chronic left shoulder pain  Stiffness of left shoulder, not elsewhere classified  Muscle weakness (generalized)  Abnormal posture     Problem List Patient Active Problem List   Diagnosis Date Noted  . Essential hypertension 05/21/2020  . Pseudophakia 11/04/2016  . Type 2 diabetes mellitus without complication, without long-term current use of insulin ([place]) 10/22/2016  . Urticaria 10/22/2016    Hilda Blades, PT, DPT, LAT, ATC 08/08/20  1:18 PM Phone: [phone] Fax: [facility] [address] [place], [place], [address] Phone: [phone]   Fax:  [phone]  Name: RENADA CRONIN MRN: [id number] Date of Birth: 08-01-1935

## 2020-08-08 NOTE — Patient Instructions (Signed)
Access Code: [email or web address] URL: [email or web address] Date: 08/08/2020 Prepared by: Hilda Blades  Exercises Supine Shoulder Flexion Extension AAROM with Dowel - 1 x daily - 7 x weekly - 10 reps - 5 second hold Supine Shoulder Horizontal Abduction with Resistance - 1 x daily - 7 x weekly - 2 sets - 10 reps Supine PNF D2 Flexion with Resistance - 1 x daily - 7 x weekly - 2 sets - 10 reps Shoulder External Rotation and Scapular Retraction with Resistance - 1 x daily - 7 x weekly - 2 sets - 10 reps Standing Row with Anchored Resistance - 1 x daily - 7 x weekly - 2 sets - 10 reps

## 2020-08-15 ENCOUNTER — Other Ambulatory Visit: Payer: Self-pay

## 2020-08-15 ENCOUNTER — Ambulatory Visit: Payer: Medicare Other | Admitting: Physical Therapy

## 2020-08-15 ENCOUNTER — Encounter: Payer: Self-pay | Admitting: Physical Therapy

## 2020-08-15 DIAGNOSIS — M25612 Stiffness of left shoulder, not elsewhere classified: Secondary | ICD-10-CM

## 2020-08-15 DIAGNOSIS — G8929 Other chronic pain: Secondary | ICD-10-CM | POA: Diagnosis not present

## 2020-08-15 DIAGNOSIS — M6281 Muscle weakness (generalized): Secondary | ICD-10-CM

## 2020-08-15 DIAGNOSIS — R293 Abnormal posture: Secondary | ICD-10-CM | POA: Diagnosis not present

## 2020-08-15 DIAGNOSIS — M25512 Pain in left shoulder: Secondary | ICD-10-CM | POA: Diagnosis not present

## 2020-08-15 NOTE — Therapy (Addendum)
[place], [place], [address] Phone: [phone]   Fax:  [phone]  Physical Therapy Treatment / Discharge  Patient Details  Name: LAKISHIA BOURASSA MRN: [id number] Date of Birth: 1936-02-04 Referring Provider (PT): Just, Laurita Quint, FNP   Encounter Date: 08/15/2020   PT End of Session - 08/15/20 1205     Visit Number 3    Number of Visits 6    Date for PT Re-Evaluation 08/29/20    Authorization Type MCR    Authorization Time Period FOTO by 6th visit, KX by 15th    Progress Note Due on Visit 10    PT Start Time 1205    PT Stop Time 1253    PT Time Calculation (min) 48 min    Activity Tolerance Patient tolerated treatment well    Behavior During Therapy [facility] for tasks assessed/performed             Past Medical History:  Diagnosis Date  . Diabetes mellitus without complication ([place])   . High cholesterol     Past Surgical History:  Procedure Laterality Date  . ABDOMINAL HYSTERECTOMY      There were no vitals filed for this visit.   Subjective Assessment - 08/15/20 1209     Subjective Exercises are going well, she thinks the shoulder is improving. Haven't been noticing the pain as much, still mostly at night.    Pain Score 5    with movement   Pain Location Shoulder    Pain Orientation Left    Pain Descriptors / Indicators Sharp    Pain Type Chronic pain    Pain Onset More than a month ago    Pain Frequency Intermittent                                               [facility] Adult PT Treatment/Exercise - 08/15/20 0001       Self-Care   Self-Care Other Self-Care Comments   FOTO results     Shoulder Exercises: Supine   Horizontal ABduction 15 reps    Theraband Level (Shoulder Horizontal ABduction) Level 1 (Yellow)    Horizontal ABduction Limitations consistent cueing for technique, keeping arms straight and control    Diagonals 15 reps;Both    Theraband Level  (Shoulder Diagonals) Level 1 (Yellow)    Diagonals Limitations constant cueing to keep elbow straight and arm adducted near head      Shoulder Exercises: Seated   Extension 10 reps   2 sets   Theraband Level (Shoulder Extension) Level 1 (Yellow)    Extension Limitations constant cueing needed to avoid shoulder shrug    Row 15 reps    Theraband Level (Shoulder Row) Level 1 (Yellow)    Row Limitations cueing to avoid shoulder shrug    External Rotation 12 reps   2 sets   Theraband Level (Shoulder External Rotation) Level 1 (Yellow)    Internal Rotation 10 reps;Both   2 sets   Other Seated Exercises double er+scap retraction, constant cueing needed and towel under bilateral arms needed x15      Shoulder Exercises: Standing   Other Standing Exercises dowel exercises x12 each (extension, cross body reach, reaching up the back)   reached to L5 after     Shoulder Exercises: ROM/Strengthening   UBE (Upper Arm Bike) L1 x5 min (  2.5 fwd/bwd)      Manual Therapy   Manual Therapy Joint mobilization;Passive ROM    Joint Mobilization Left GHJ inferior and posterior in various ranges of elevation grade III-IV    Passive ROM Left shoulder all directions                          PT Education - 08/15/20 1254     Education Details HEP review    Person(s) Educated Patient    Methods Explanation;Demonstration;Tactile cues;Verbal cues    Comprehension Verbalized understanding;Need further instruction;Tactile cues required;Verbal cues required;Returned demonstration              PT Short Term Goals - 08/15/20 1525       PT SHORT TERM GOAL #1   Title Patient will be I with initial HEP to progress with PT    Time 3    Period Weeks    Status On-going    Target Date 08/08/20      PT SHORT TERM GOAL #2   Title Patient will demonstrate appropriate seated posture and methods to use phone with </= 3/10 pain level    Baseline Patient reports minimal pain while using phone, has tried  supporting phone on pillow    Time 3    Period Weeks    Status Achieved    Target Date 08/08/20      PT SHORT TERM GOAL #3   Title PT will review FOTO with patient by 3rd visit to understand anticipated improvement    Status Achieved                PT Long Term Goals - 07/18/20 1234       PT LONG TERM GOAL #1   Title Patient will be I with final HEP o maintain progress from PT    Time 6    Period Weeks    Status New    Target Date 08/29/20      PT LONG TERM GOAL #2   Title Patient will exhibit left shoulder elevation AROM >/= 140 deg to improve reaching to cabinets    Time 6    Period Weeks    Status New    Target Date 08/29/20      PT LONG TERM GOAL #3   Title Patient will be able to reach behind her to turn off bedside light with </= 2/10 pain level to reduce functional limitations    Time 6    Period Weeks    Status New    Target Date 08/29/20      PT LONG TERM GOAL #4   Title Patient will exhibit left shoulder flexion and external rotator strength grossly >/= 4+/5 MMT to improve tolerance for holding phone    Time 6    Period Weeks    Status New    Target Date 08/29/20      PT LONG TERM GOAL #5   Title Patient will report improved functional status >/= 58% on FOTO    Time 6    Period Weeks    Status New    Target Date 08/29/20                        Plan - 08/15/20 1256     Clinical Impression Statement Pt tolerated PT well with no adverse effects. Pt required constant multimodal cueing to do exercises with correct form, so HEP was kept  the same. After manual and dowel exercises, pt was able to reach to about L5 for functional internal rotation. Continues to show increased periscapular and rotator cuff strength as well as increase in ROM in all directions. Pt continues to benefit from skilled PT in order to increase L UE ROM and strength and postural endurance in order to decrease pain and increase functional usage of the L UE.    PT  Treatment/Interventions ADLs/Self Care Home Management;Cryotherapy;Electrical Stimulation;Iontophoresis 40m/ml Dexamethasone;Moist Heat;Neuromuscular re-education;Therapeutic exercise;Therapeutic activities;Functional mobility training;Patient/family education;Manual techniques;Dry needling;Passive range of motion;Taping;Spinal Manipulations;Joint Manipulations    PT Next Visit Plan Review HEP and progress PRN, manual for left shoulder mobility, progress rotator cuff and postural strengthening    PT Home Exercise Plan [id number]            Patient will benefit from skilled therapeutic intervention in order to improve the following deficits and impairments:  Postural dysfunction,Decreased strength,Pain,Decreased range of motion,Decreased activity tolerance  Visit Diagnosis: Chronic left shoulder pain  Stiffness of left shoulder, not elsewhere classified  Muscle weakness (generalized)  Abnormal posture      Problem List Patient Active Problem List   Diagnosis Date Noted  . Essential hypertension 05/21/2020  . Pseudophakia 11/04/2016  . Type 2 diabetes mellitus without complication, without long-term current use of insulin ([place] 10/22/2016  . Urticaria 10/22/2016    CCaleb Popp SPT 08/15/2020, 3:26 PM  [facility] [address]Phone: [phone]  Fax:  [phone] Name: MJULYSSA KYERMRN: [id number] of Birth: [birth year]  PHYSICAL THERAPY DISCHARGE SUMMARY  Visits from Start of Care: 3  Current functional level related to goals / functional outcomes: See above   Remaining deficits: See above   Education / Equipment: HEP  Plan:                                                    Patient goals were partially met. Patient is being discharged due to not returning since the last visit.  ?????    CHilda Blades PT, DPT, LAT, ATC 09/12/20  11:57 AM Phone: [phone]:  [phone]

## 2020-08-23 ENCOUNTER — Other Ambulatory Visit: Payer: Self-pay

## 2020-08-23 ENCOUNTER — Encounter: Payer: Self-pay | Admitting: Family Medicine

## 2020-08-23 ENCOUNTER — Ambulatory Visit (INDEPENDENT_AMBULATORY_CARE_PROVIDER_SITE_OTHER): Payer: Medicare Other | Admitting: Family Medicine

## 2020-08-23 VITALS — BP 146/76 | HR 78 | Temp 98.0°F | Ht 64.0 in | Wt 123.0 lb

## 2020-08-23 DIAGNOSIS — I10 Essential (primary) hypertension: Secondary | ICD-10-CM

## 2020-08-23 DIAGNOSIS — E119 Type 2 diabetes mellitus without complications: Secondary | ICD-10-CM | POA: Diagnosis not present

## 2020-08-23 DIAGNOSIS — E785 Hyperlipidemia, unspecified: Secondary | ICD-10-CM | POA: Diagnosis not present

## 2020-08-23 NOTE — Patient Instructions (Addendum)
  Health Maintenance After Age 83 After age 83, you are at a higher risk for certain long-term diseases and infections as well as injuries from falls. Falls are a major cause of broken bones and head injuries in people who are older than age 83. Getting regular preventive care can help to keep you healthy and well. Preventive care includes getting regular testing and making lifestyle changes as recommended by your health care provider. Talk with your health care provider about:  Which screenings and tests you should have. A screening is a test that checks for a disease when you have no symptoms.  A diet and exercise plan that is right for you. What should I know about screenings and tests to prevent falls? Screening and testing are the best ways to find a health problem early. Early diagnosis and treatment give you the best chance of managing medical conditions that are common after age 83. Certain conditions and lifestyle choices may make you more likely to have a fall. Your health care provider may recommend:  Regular vision checks. Poor vision and conditions such as cataracts can make you more likely to have a fall. If you wear glasses, make sure to get your prescription updated if your vision changes.  Medicine review. Work with your health care provider to regularly review all of the medicines you are taking, including over-the-counter medicines. Ask your health care provider about any side effects that may make you more likely to have a fall. Tell your health care provider if any medicines that you take make you feel dizzy or sleepy.  Osteoporosis screening. Osteoporosis is a condition that causes the bones to get weaker. This can make the bones weak and cause them to break more easily.  Blood pressure screening. Blood pressure changes and medicines to control blood pressure can make you feel dizzy.  Strength and balance checks. Your health care provider may recommend certain tests to  check your strength and balance while standing, walking, or changing positions.  Foot health exam. Foot pain and numbness, as well as not wearing proper footwear, can make you more likely to have a fall.  Depression screening. You may be more likely to have a fall if you have a fear of falling, feel emotionally low, or feel unable to do activities that you used to do.  Alcohol use screening. Using too much alcohol can affect your balance and may make you more likely to have a fall. What actions can I take to lower my risk of falls? General instructions  Talk with your health care provider about your risks for falling. Tell your health care provider if: ? You fall. Be sure to tell your health care provider about all falls, even ones that seem minor. ? You feel dizzy, sleepy, or off-balance.  Take over-the-counter and prescription medicines only as told by your health care provider. These include any supplements.  Eat a healthy diet and maintain a healthy weight. A healthy diet includes low-fat dairy products, low-fat (lean) meats, and fiber from whole grains, beans, and lots of fruits and vegetables. Home safety  Remove any tripping hazards, such as rugs, cords, and clutter.  Install safety equipment such as grab bars in bathrooms and safety rails on stairs.  Keep rooms and walkways well-lit. Activity  Follow a regular exercise program to stay fit. This will help you maintain your balance. Ask your health care provider what types of exercise are appropriate for you.  If you need a cane   or walker, use it as recommended by your health care provider.  Wear supportive shoes that have nonskid soles.   Lifestyle  Do not drink alcohol if your health care provider tells you not to drink.  If you drink alcohol, limit how much you have: ? 0-1 drink a day for women. ? 0-2 drinks a day for men.  Be aware of how much alcohol is in your drink. In the [place] one drink equals one typical bottle  of beer (12 oz), one-half glass of wine (5 oz), or one shot of hard liquor (1 oz).  Do not use any products that contain nicotine or tobacco, such as cigarettes and e-cigarettes. If you need help quitting, ask your health care provider. Summary  Having a healthy lifestyle and getting preventive care can help to protect your health and wellness after age 83.  Screening and testing are the best way to find a health problem early and help you avoid having a fall. Early diagnosis and treatment give you the best chance for managing medical conditions that are more common for people who are older than age 83.  Falls are a major cause of broken bones and head injuries in people who are older than age 83. Take precautions to prevent a fall at home.  Work with your health care provider to learn what changes you can make to improve your health and wellness and to prevent falls. This information is not intended to replace advice given to you by your health care provider. Make sure you discuss any questions you have with your health care provider. Document Revised: 09/30/2018 Document Reviewed: 04/22/2017 Elsevier Patient Education  2021 Elsevier Inc.   If you have lab work done today you will be contacted with your lab results within the next 2 weeks.  If you have not heard from us then please contact us. The fastest way to get your results is to register for My Chart.   IF you received an x-ray today, you will receive an invoice from [facility]. Please contact [facility] at [phone] with questions or concerns regarding your invoice.   IF you received labwork today, you will receive an invoice from [facility]. Please contact [facility] at [phone] with questions or concerns regarding your invoice.   Our billing staff will not be able to assist you with questions regarding bills from these companies.  You will be contacted with the lab results as soon as they are available.  The fastest way to get your results is to activate your My Chart account. Instructions are located on the last page of this paperwork. If you have not heard from us regarding the results in 2 weeks, please contact this office.      

## 2020-08-23 NOTE — Progress Notes (Signed)
3/3/202211:09 AM  Megan Wolfe 27-Mar-1936, 85 y.o., female [id number]  Chief Complaint  Patient presents with  . 3 month follow up   . Diabetes  . Hypertension    HPI:   Patient is a 85 y.o. female with past medical history significant for DM, HTN who presents today for DM, HTN, HLD follow up.  Recently wrote a book on crypto currency Is interested in the stock market Talks to people about investing  Diabetes Last OV stopped metformin and started Jardiance due to GI side effects Has tried [employer], Jardiance, Farxiga Has had issues with cost of medication Home BG in the am 140-120 Denies episodes of hypoglycemia Continues to work on [employer]  Lab Results  Component Value Date   HGBA1C 7.5 (H) 04/25/2020   Left Shoulder pain Last OV prescribed voltaren gel and PT Doing much better   HTN Lisinopril 5 mg daily Aspirin 81 mg daily Goal< 130/80 Doesn't want to increase medications BP Readings from Last 3 Encounters:  08/23/20 (!) 146/76  06/25/20 137/76  05/21/20 122/72   HLD Crestor 10 mg daily Lab Results  Component Value Date   CHOL 238 (H) 04/25/2020   HDL 46 04/25/2020   LDLCALC 170 (H) 04/25/2020   TRIG 123 04/25/2020   CHOLHDL 5.2 (H) 04/25/2020     Health Maintenance  Topic Date Due  . COVID-19 Vaccine (3 - Booster for Pfizer series) 03/09/2020  . TETANUS/TDAP  09/15/2020 (Originally 08/06/1954)  . INFLUENZA VACCINE  09/20/2020 (Originally 01/22/2020)  . DEXA SCAN  05/21/2021 (Originally 08/06/2002)  . HEMOGLOBIN A1C  10/23/2020  . OPHTHALMOLOGY EXAM  11/27/2020  . FOOT EXAM  04/25/2021  . PNA vac Low Risk Adult  Completed  . HPV VACCINES  Aged Out      Depression screen [facility] 2/9 08/23/2020 06/25/2020 04/30/2020  Decreased Interest 0 0 -  Down, Depressed, Hopeless 0 0 0  PHQ - 2 Score 0 0 0    Fall Risk  08/23/2020 06/25/2020 04/30/2020 04/25/2020 11/09/2019  Falls in the past year? 0 0 0 0 0  Number falls in past yr: 0 0 0 0 0  Injury with  Fall? 0 0 0 0 0  Follow up Falls evaluation completed Falls evaluation completed Falls evaluation completed Falls evaluation completed Falls evaluation completed;Education provided     No Known Allergies  Prior to Admission medications   Medication Sig Start Date End Date Taking? Authorizing Provider  Accu-Chek Softclix Lancets lancets SMARTSIG:Topical 02/28/20   [provider]  aspirin EC 81 MG tablet Take 324 mg by mouth once.  Patient not taking: Reported on 05/21/2020    [provider]  Blood Glucose Monitoring Suppl (ACCU-CHEK GUIDE ME) w/Device KIT  02/28/20   [provider]  dapagliflozin propanediol (FARXIGA) 5 MG TABS tablet Take 1 tablet (5 mg total) by mouth daily before breakfast. 05/25/20   Alaila Pillard, Laurita Quint, FNP  lisinopril (ZESTRIL) 5 MG tablet Take 1 tablet (5 mg total) by mouth daily. 04/25/20   Jude Naclerio, Laurita Quint, FNP  rosuvastatin (CRESTOR) 10 MG tablet TAKE 1 TABLET BY MOUTH EVERY OTHER DAY. PLEASE SCHEDULE AN OFFICE VISIT 04/25/20   Zandrea Kenealy, Laurita Quint, FNP  tetrahydrozoline 0.05 % ophthalmic solution Place 1 drop into both eyes as needed. For dry eyes. Patient not taking: Reported on 05/21/2020    [provider]  TRUE METRIX BLOOD GLUCOSE TEST test strip TEST BLOOD GLUCOSE THREE TIMES A DAY. USE AS INSTRUCTED 08/27/19   Delia Chimes  A, MD    Past Medical History:  Diagnosis Date  . Diabetes mellitus without complication ([place])   . High cholesterol     Past Surgical History:  Procedure Laterality Date  . ABDOMINAL HYSTERECTOMY      Social History   Tobacco Use  . Smoking status: Never Smoker  . Smokeless tobacco: Never Used  Substance Use Topics  . Alcohol use: Yes    Comment: red wine occasional     Family History  Problem Relation Age of Onset  . Diabetes Mother   . Diabetes Sister   . Breast cancer Neg Hx     Review of Systems  Constitutional: Negative for chills, fever and malaise/fatigue.  Eyes: Negative for blurred  vision and double vision.  Respiratory: Negative for cough, shortness of breath and wheezing.   Cardiovascular: Negative for chest pain, palpitations and leg swelling.  Gastrointestinal: Negative for abdominal pain, blood in stool, constipation, diarrhea, heartburn, nausea and vomiting.  Genitourinary: Negative for dysuria, frequency and hematuria.  Musculoskeletal: Negative for back pain and joint pain.  Skin: Negative for rash.  Neurological: Negative for dizziness, tingling, sensory change, weakness and headaches.    OBJECTIVE:  Today's Vitals   08/23/20 1044  BP: (!) 146/76  Pulse: 78  Temp: 98 F (36.7 C)  SpO2: 98%  Weight: 123 lb (55.8 kg)  Height: '5\' 4"'  (1.626 m)   Body mass index is 21.11 kg/m.   Physical Exam Constitutional:      General: She is not in acute distress.    Appearance: Normal appearance. She is not ill-appearing.  HENT:     Head: Normocephalic.  Cardiovascular:     Rate and Rhythm: Normal rate and regular rhythm.     Pulses: Normal pulses.     Heart sounds: Normal heart sounds. No murmur heard. No friction rub. No gallop.   Pulmonary:     Effort: Pulmonary effort is normal. No respiratory distress.     Breath sounds: Normal breath sounds. No stridor. No wheezing, rhonchi or rales.  Abdominal:     General: Bowel sounds are normal.     Palpations: Abdomen is soft.     Tenderness: There is no abdominal tenderness.  Musculoskeletal:     Cervical back: Normal.     Right lower leg: No edema.     Left lower leg: No edema.  Skin:    General: Skin is warm and dry.  Neurological:     Mental Status: She is alert and oriented to person, place, and time.  Psychiatric:        Mood and Affect: Mood normal.        Behavior: Behavior normal.     No results found for this or any previous visit (from the past 24 hour(s)).  No results found.   ASSESSMENT and PLAN  Problem List Items Addressed This Visit      Cardiovascular and Mediastinum    Essential hypertension   Relevant Orders   CMP14+EGFR     Endocrine   Type 2 diabetes mellitus without complication, without long-term current use of insulin ([place]) - Primary   Relevant Orders   Hemoglobin A1c    Other Visit Diagnoses    Dyslipidemia       Relevant Orders   Lipid Panel          Plan  Chronic conditions stable on current regimens  Declines increasing BP medications at this time  Will follow up with lab results  Return in  about 3 months (around 11/23/2020).   Huston Foley Edmund Rick, FNP-BC [facility] [place], [place] [address] Ph.  [phone] Fax [phone]

## 2020-08-24 LAB — CMP14+EGFR
ALT: 16 IU/L (ref 0–32)
AST: 20 IU/L (ref 0–40)
Albumin/Globulin Ratio: 1.5 (ref 1.2–2.2)
Albumin: 4.3 g/dL (ref 3.6–4.6)
Alkaline Phosphatase: 71 IU/L (ref 44–121)
BUN/Creatinine Ratio: 16 (ref 12–28)
BUN: 12 mg/dL (ref 8–27)
Bilirubin Total: <0.2 mg/dL (ref 0.0–1.2)
CO2: 19 mmol/L — ABNORMAL LOW (ref 20–29)
Calcium: 9.1 mg/dL (ref 8.7–10.3)
Chloride: 106 mmol/L (ref 96–106)
Creatinine, Ser: 0.77 mg/dL (ref 0.57–1.00)
Globulin, Total: 2.9 g/dL (ref 1.5–4.5)
Glucose: 183 mg/dL — ABNORMAL HIGH (ref 65–99)
Potassium: 4.3 mmol/L (ref 3.5–5.2)
Sodium: 140 mmol/L (ref 134–144)
Total Protein: 7.2 g/dL (ref 6.0–8.5)
eGFR: 76 mL/min/{1.73_m2} (ref >59)

## 2020-08-24 LAB — HEMOGLOBIN A1C
Est. average glucose Bld gHb Est-mCnc: 197 mg/dL
Hgb A1c MFr Bld: 8.5 % — ABNORMAL HIGH (ref 4.8–5.6)

## 2020-08-24 LAB — LIPID PANEL
Chol/HDL Ratio: 3.4 ratio (ref 0.0–4.4)
Cholesterol, Total: 222 mg/dL — ABNORMAL HIGH (ref 100–199)
HDL: 66 mg/dL (ref >39)
LDL Chol Calc (NIH): 141 mg/dL — ABNORMAL HIGH (ref 0–99)
Triglycerides: 87 mg/dL (ref 0–149)
VLDL Cholesterol Cal: 15 mg/dL (ref 5–40)

## 2020-08-27 ENCOUNTER — Other Ambulatory Visit: Payer: Self-pay | Admitting: Family Medicine

## 2020-08-27 ENCOUNTER — Telehealth: Payer: Self-pay | Admitting: Family Medicine

## 2020-08-27 DIAGNOSIS — E785 Hyperlipidemia, unspecified: Secondary | ICD-10-CM

## 2020-08-27 DIAGNOSIS — E119 Type 2 diabetes mellitus without complications: Secondary | ICD-10-CM

## 2020-08-27 MED ORDER — ROSUVASTATIN CALCIUM 20 MG PO TABS: 20.0000 mg | ORAL_TABLET | Freq: Every day | ORAL | 3 refills | Status: DC

## 2020-08-27 MED ORDER — EMPAGLIFLOZIN 25 MG PO TABS: 25.0000 mg | ORAL_TABLET | Freq: Every day | ORAL | 3 refills | Status: DC

## 2020-08-27 NOTE — Telephone Encounter (Signed)
Have her take 2 crestor for a total of 20 mg until she gets her next refill and they will be 20 mg tabs so she will only need to take one then. The same for the Jardiance. Take 2 of her current dose for a total of 20mg  and when she picks up her refills she will only need to take 1 tab.

## 2020-08-27 NOTE — Telephone Encounter (Signed)
Patient has been informed regarding the increase of jardiance and crestor, she verbalized understanding.

## 2020-08-27 NOTE — Telephone Encounter (Signed)
THIS PATIENT CALLED TO SPEAK WITH KELSEA JUST. SHE NEEDS TO DISCUSS HER MEDICATIONS. SHE SAID HER CHOLESTEROL AND A1-C NUMBERS WERE HIGH. SHE THINKS SHE NEEDS TO GET MORE MEDICATIONS DUE TO SHE DOES NOT SEE A NEW PROVIDER UNTIL 11/28/20. DR. Charlott Rakes. BEST PHONE [phone] (CELL) [place]

## 2020-08-27 NOTE — Telephone Encounter (Signed)
She would like to move forward with the increase in medications for both cholesterol and DM per previous message.

## 2020-10-23 DIAGNOSIS — Z23 Encounter for immunization: Secondary | ICD-10-CM | POA: Diagnosis not present

## 2020-11-28 ENCOUNTER — Encounter: Payer: Self-pay | Admitting: Family Medicine

## 2020-11-28 ENCOUNTER — Other Ambulatory Visit: Payer: Self-pay

## 2020-11-28 ENCOUNTER — Ambulatory Visit: Payer: Medicare Other | Attending: Family Medicine | Admitting: Family Medicine

## 2020-11-28 VITALS — BP 119/61 | HR 71 | Ht 64.0 in | Wt 124.2 lb

## 2020-11-28 DIAGNOSIS — Z7182 Exercise counseling: Secondary | ICD-10-CM | POA: Insufficient documentation

## 2020-11-28 DIAGNOSIS — Z79899 Other long term (current) drug therapy: Secondary | ICD-10-CM | POA: Diagnosis not present

## 2020-11-28 DIAGNOSIS — Z7984 Long term (current) use of oral hypoglycemic drugs: Secondary | ICD-10-CM | POA: Insufficient documentation

## 2020-11-28 DIAGNOSIS — E785 Hyperlipidemia, unspecified: Secondary | ICD-10-CM | POA: Diagnosis not present

## 2020-11-28 DIAGNOSIS — E1169 Type 2 diabetes mellitus with other specified complication: Secondary | ICD-10-CM | POA: Diagnosis not present

## 2020-11-28 DIAGNOSIS — Z0000 Encounter for general adult medical examination without abnormal findings: Secondary | ICD-10-CM | POA: Diagnosis not present

## 2020-11-28 DIAGNOSIS — R1031 Right lower quadrant pain: Secondary | ICD-10-CM

## 2020-11-28 DIAGNOSIS — Z713 Dietary counseling and surveillance: Secondary | ICD-10-CM | POA: Diagnosis not present

## 2020-11-28 DIAGNOSIS — E1159 Type 2 diabetes mellitus with other circulatory complications: Secondary | ICD-10-CM

## 2020-11-28 DIAGNOSIS — I10 Essential (primary) hypertension: Secondary | ICD-10-CM | POA: Diagnosis not present

## 2020-11-28 DIAGNOSIS — I152 Hypertension secondary to endocrine disorders: Secondary | ICD-10-CM | POA: Diagnosis not present

## 2020-11-28 DIAGNOSIS — Z791 Long term (current) use of non-steroidal anti-inflammatories (NSAID): Secondary | ICD-10-CM | POA: Insufficient documentation

## 2020-11-28 LAB — POCT GLYCOSYLATED HEMOGLOBIN (HGB A1C): HbA1c, POC (controlled diabetic range): 7.4 % — AB (ref 0.0–7.0)

## 2020-11-28 LAB — GLUCOSE, POCT (MANUAL RESULT ENTRY): POC Glucose: 133 mg/dl — AB (ref 70–99)

## 2020-11-28 MED ORDER — DICLOFENAC SODIUM 1 % EX GEL: 2.0000 g | Freq: Four times a day (QID) | CUTANEOUS | 3 refills | Status: AC

## 2020-11-28 NOTE — Progress Notes (Signed)
having pain in groin area.

## 2020-11-28 NOTE — Progress Notes (Signed)
Subjective:  Patient ID: Megan Wolfe, female    DOB: Jun 16, 1936  Age: 85 y.o. MRN: [id number]  CC: New Patient (Initial Visit)   HPI LYLAH LANTIS is an 85 year old female with a history of type 2 diabetes mellitus (A1c 7.4), hyperlipidemia, hypertension here to establish care.  She was previously followed by [place] primary care until the practice closed.  Interval History: She has no hypoglycemia, Neuropathy symptoms or visual concerns. Not up to date on Ophthalmology exams - needs to make an appointment.  Compliant with her Jardiance and she does have adequate refills. Compliant with her antihypertensive and her statin.  Of note her last blood work revealed elevated LDL of 141. Complains of R groin pain x1 month which is intermittent and worse with certain movements with no preceding history of trauma.  Pain is absent at this time.  She has also noticed a sensation in her right upper chest wall which is intermittent and she is unable to use appropriate words to describe it.  Denies presence of paresthesia, dyspnea. Past Medical History:  Diagnosis Date  . Diabetes mellitus without complication ([place])   . High cholesterol     Past Surgical History:  Procedure Laterality Date  . ABDOMINAL HYSTERECTOMY      Family History  Problem Relation Age of Onset  . Diabetes Mother   . Diabetes Sister   . Breast cancer Neg Hx     No Known Allergies  Outpatient Medications Prior to Visit  Medication Sig Dispense Refill  . Accu-Chek Softclix Lancets lancets SMARTSIG:Topical    . aspirin EC 81 MG tablet Take 324 mg by mouth once.    . Blood Glucose Monitoring Suppl (ACCU-CHEK GUIDE ME) w/Device KIT     . empagliflozin (JARDIANCE) 25 MG TABS tablet Take 1 tablet (25 mg total) by mouth daily before breakfast. 90 tablet 3  . lisinopril (ZESTRIL) 5 MG tablet Take 1 tablet (5 mg total) by mouth daily. 90 tablet 3  . rosuvastatin (CRESTOR) 20 MG tablet Take 1 tablet (20 mg total) by  mouth daily. TAKE 1 TABLET BY MOUTH EVERY OTHER DAY. PLEASE SCHEDULE AN OFFICE VISIT 90 tablet 3  . tetrahydrozoline 0.05 % ophthalmic solution Place 1 drop into both eyes as needed. For dry eyes.    . TRUE METRIX BLOOD GLUCOSE TEST test strip TEST BLOOD GLUCOSE THREE TIMES A DAY. USE AS INSTRUCTED 100 strip 12  . diclofenac Sodium (VOLTAREN) 1 % GEL Apply 2 g topically 4 (four) times daily. 50 g 3   No facility-administered medications prior to visit.     ROS Review of Systems  Constitutional: Negative for activity change, appetite change and fatigue.  HENT: Negative for congestion, sinus pressure and sore throat.   Eyes: Negative for visual disturbance.  Respiratory: Negative for cough, chest tightness, shortness of breath and wheezing.   Cardiovascular: Negative for chest pain and palpitations.  Gastrointestinal: Negative for abdominal distention, abdominal pain and constipation.  Endocrine: Negative for polydipsia.  Genitourinary: Negative for dysuria and frequency.  Musculoskeletal: Negative for arthralgias and back pain.  Skin: Negative for rash.  Neurological: Negative for tremors, light-headedness and numbness.  Hematological: Does not bruise/bleed easily.  Psychiatric/Behavioral: Negative for agitation and behavioral problems.    Objective:  BP 119/61   Pulse 71   Ht '5\' 4"'  (1.626 m)   Wt 124 lb 3.2 oz (56.3 kg)   SpO2 99%   BMI 21.32 kg/m   BP/Weight 11/28/2020 06/29/7937 0/08/90  Systolic BP  034 917 915  Diastolic BP 61 76 76  Wt. (Lbs) 124.2 123 122  BMI 21.32 21.11 20.94      Physical Exam Constitutional:      Appearance: She is well-developed.  Neck:     Vascular: No JVD.  Cardiovascular:     Rate and Rhythm: Normal rate.     Heart sounds: Normal heart sounds. No murmur heard.   Pulmonary:     Effort: Pulmonary effort is normal.     Breath sounds: Normal breath sounds. No wheezing or rales.  Chest:     Chest wall: No tenderness.  Abdominal:      General: Bowel sounds are normal. There is no distension.     Palpations: Abdomen is soft. There is no mass.     Tenderness: There is no abdominal tenderness.  Musculoskeletal:        General: Normal range of motion.     Right lower leg: No edema.     Left lower leg: No edema.     Comments: No tenderness elicited on palpation of groin  Neurological:     Mental Status: She is alert and oriented to person, place, and time.  Psychiatric:        Mood and Affect: Mood normal.     CMP Latest Ref Rng & Units 08/23/2020 04/25/2020 09/16/2019  Glucose 65 - 99 mg/dL 183(H) 239(H) 201(H)  BUN 8 - 27 mg/dL '12 10 9  ' Creatinine 0.57 - 1.00 mg/dL 0.77 0.73 0.87  Sodium 134 - 144 mmol/L 140 139 141  Potassium 3.5 - 5.2 mmol/L 4.3 3.9 4.0  Chloride 96 - 106 mmol/L 106 103 105  CO2 20 - 29 mmol/L 19(L) 23 20  Calcium 8.7 - 10.3 mg/dL 9.1 9.7 9.8  Total Protein 6.0 - 8.5 g/dL 7.2 7.1 7.5  Total Bilirubin 0.0 - 1.2 mg/dL <0.2 <0.2 0.3  Alkaline Phos 44 - 121 IU/L 71 97 82  AST 0 - 40 IU/L '20 18 16  ' ALT 0 - 32 IU/L '16 16 9    ' Lipid Panel     Component Value Date/Time   CHOL 222 (H) 08/23/2020 1118   TRIG 87 08/23/2020 1118   HDL 66 08/23/2020 1118   CHOLHDL 3.4 08/23/2020 1118   LDLCALC 141 (H) 08/23/2020 1118    CBC    Component Value Date/Time   WBC 4.0 03/18/2019 1020   WBC 5.1 01/23/2017 1137   RBC 4.20 03/18/2019 1020   RBC 4.33 01/23/2017 1137   HGB 13.0 03/18/2019 1020   HCT 37.7 03/18/2019 1020   PLT 286 03/18/2019 1020   MCV 90 03/18/2019 1020   MCH 31.0 03/18/2019 1020   MCH 30.3 01/23/2017 1137   MCHC 34.5 03/18/2019 1020   MCHC 34.8 01/23/2017 1137   RDW 13.8 03/18/2019 1020   LYMPHSABS 1.8 10/22/2016 1341   EOSABS 0.1 10/22/2016 1341   BASOSABS 0.0 10/22/2016 1341    Lab Results  Component Value Date   HGBA1C 7.4 (A) 11/28/2020    Assessment & Plan:  1. Type 2 diabetes mellitus with other specified complication, without long-term current use of insulin  (HCC) Controlled with A1c of 7.4 Due to age, A1c goal is less than 7.5 to prevent hypoglycemia Continue Jardiance Counseled on Diabetic diet, my plate method, 056 minutes of moderate intensity exercise/week Blood sugar logs with fasting goals of 80-120 mg/dl, random of less than 180 and in the event of sugars less than 60 mg/dl or greater than 400  mg/dl encouraged to notify the clinic. Advised on the need for annual eye exams, annual foot exams, Pneumonia vaccine. - POCT glucose (manual entry) - POCT glycosylated hemoglobin (Hb A1C)  2. Hyperlipidemia associated with type 2 diabetes mellitus ([place]) Uncontrolled Continue Crestor Lipid panel at next visit when she is fasting  3. Hypertension associated with diabetes ([place]) Controlled Counseled on blood pressure goal of less than 130/80, low-sodium, DASH diet, medication compliance, 150 minutes of moderate intensity exercise per week. Discussed medication compliance, adverse effects.  4. Right inguinal pain Pain is absent at this time Could be secondary to inflammation of inguinal ligament versus pelvic bone etiology Prescription for Voltaren gel provided. - diclofenac Sodium (VOLTAREN) 1 % GEL; Apply 2 g topically 4 (four) times daily.  Dispense: 100 g; Refill: 3   Meds ordered this encounter  Medications  . diclofenac Sodium (VOLTAREN) 1 % GEL    Sig: Apply 2 g topically 4 (four) times daily.    Dispense:  100 g    Refill:  3    Follow-up: Return in about 3 months (around 02/28/2021) for Chronic medical conditions.       Charlott Rakes, MD, FAAFP. [facility] and [facility] [place], [place]   11/28/2020, 2:48 PM

## 2020-11-28 NOTE — Patient Instructions (Signed)
Diabetes Mellitus and Exercise Exercising regularly is important for overall health, especially for people who have diabetes mellitus. Exercising is not only about losing weight. It has many other health benefits, such as increasing muscle strength and bone density and reducing body fat and stress. This leads to improved fitness, flexibility, and endurance, all of which result in better overall health. What are the benefits of exercise if I have diabetes? Exercise has many benefits for people with diabetes. They include:  Helping to lower and control blood sugar (glucose).  Helping the body to respond better to the hormone insulin by improving insulin sensitivity.  Reducing how much insulin the body needs.  Lowering the risk for heart disease by: ? Lowering "bad" cholesterol and triglyceride levels. ? Increasing "good" cholesterol levels. ? Lowering blood pressure. ? Lowering blood glucose levels. What is my activity plan? Your health care provider or certified diabetes educator can help you make a plan for the type and frequency of exercise that works for you. This is called your activity plan. Be sure to:  Get at least 150 minutes of medium-intensity or high-intensity exercise each week. Exercises may include brisk walking, biking, or water aerobics.  Do stretching and strengthening exercises, such as yoga or weight lifting, at least 2 times a week.  Spread out your activity over at least 3 days of the week.  Get some form of physical activity each day. ? Do not go more than 2 days in a row without some kind of physical activity. ? Avoid being inactive for more than 90 minutes at a time. Take frequent breaks to walk or stretch.  Choose exercises or activities that you enjoy. Set realistic goals.  Start slowly and gradually increase your exercise intensity over time.   How do I manage my diabetes during exercise? Monitor your blood glucose  Check your blood glucose before and  after exercising. If your blood glucose is: ? 240 mg/dL (13.3 mmol/L) or higher before you exercise, check your urine for ketones. These are chemicals created by the liver. If you have ketones in your urine, do not exercise until your blood glucose returns to normal. ? 100 mg/dL (5.6 mmol/L) or lower, eat a snack containing 15-20 grams of carbohydrate. Check your blood glucose 15 minutes after the snack to make sure that your glucose level is above 100 mg/dL (5.6 mmol/L) before you start your exercise.  Know the symptoms of low blood glucose (hypoglycemia) and how to treat it. Your risk for hypoglycemia increases during and after exercise. Follow these tips and your health care provider's instructions  Keep a carbohydrate snack that is fast-acting for use before, during, and after exercise to help prevent or treat hypoglycemia.  Avoid injecting insulin into areas of the body that are going to be exercised. For example, avoid injecting insulin into: ? Your arms, when you are about to play tennis. ? Your legs, when you are about to go jogging.  Keep records of your exercise habits. Doing this can help you and your health care provider adjust your diabetes management plan as needed. Write down: ? Food that you eat before and after you exercise. ? Blood glucose levels before and after you exercise. ? The type and amount of exercise you have done.  Work with your health care provider when you start a new exercise or activity. He or she may need to: ? Make sure that the activity is safe for you. ? Adjust your insulin, other medicines, and food that   you eat.  Drink plenty of water while you exercise. This prevents loss of water (dehydration) and problems caused by a lot of heat in the body (heat stroke).   Where to find more information  American Diabetes Association: www.diabetes.org Summary  Exercising regularly is important for overall health, especially for people who have diabetes  mellitus.  Exercising has many health benefits. It increases muscle strength and bone density and reduces body fat and stress. It also lowers and controls blood glucose.  Your health care provider or certified diabetes educator can help you make an activity plan for the type and frequency of exercise that works for you.  Work with your health care provider to make sure any new activity is safe for you. Also work with your health care provider to adjust your insulin, other medicines, and the food you eat. This information is not intended to replace advice given to you by your health care provider. Make sure you discuss any questions you have with your health care provider. Document Revised: 03/07/2019 Document Reviewed: 03/07/2019 Elsevier Patient Education  2021 Elsevier Inc.  

## 2021-02-28 ENCOUNTER — Ambulatory Visit: Payer: Medicare Other | Admitting: Family Medicine

## 2021-03-07 ENCOUNTER — Other Ambulatory Visit: Payer: Self-pay

## 2021-03-08 ENCOUNTER — Encounter: Payer: Self-pay | Admitting: Internal Medicine

## 2021-03-08 ENCOUNTER — Ambulatory Visit (INDEPENDENT_AMBULATORY_CARE_PROVIDER_SITE_OTHER): Payer: Medicare Other | Admitting: Internal Medicine

## 2021-03-08 VITALS — BP 118/80 | HR 50 | Resp 18 | Ht 64.0 in | Wt 128.4 lb

## 2021-03-08 DIAGNOSIS — E1169 Type 2 diabetes mellitus with other specified complication: Secondary | ICD-10-CM | POA: Diagnosis not present

## 2021-03-08 DIAGNOSIS — E119 Type 2 diabetes mellitus without complications: Secondary | ICD-10-CM | POA: Diagnosis not present

## 2021-03-08 DIAGNOSIS — I10 Essential (primary) hypertension: Secondary | ICD-10-CM | POA: Diagnosis not present

## 2021-03-08 DIAGNOSIS — E118 Type 2 diabetes mellitus with unspecified complications: Secondary | ICD-10-CM

## 2021-03-08 DIAGNOSIS — E785 Hyperlipidemia, unspecified: Secondary | ICD-10-CM | POA: Diagnosis not present

## 2021-03-08 LAB — LIPID PANEL
Cholesterol: 208 mg/dL — ABNORMAL HIGH (ref 0–200)
HDL: 49.8 mg/dL (ref >39.00)
LDL Cholesterol: 141 mg/dL — ABNORMAL HIGH (ref 0–99)
NonHDL: 158.25
Total CHOL/HDL Ratio: 4
Triglycerides: 88 mg/dL (ref 0.0–149.0)
VLDL: 17.6 mg/dL (ref 0.0–40.0)

## 2021-03-08 LAB — COMPREHENSIVE METABOLIC PANEL
ALT: 15 U/L (ref 0–35)
AST: 17 U/L (ref 0–37)
Albumin: 3.9 g/dL (ref 3.5–5.2)
Alkaline Phosphatase: 62 U/L (ref 39–117)
BUN: 11 mg/dL (ref 6–23)
CO2: 25 mEq/L (ref 19–32)
Calcium: 9 mg/dL (ref 8.4–10.5)
Chloride: 107 mEq/L (ref 96–112)
Creatinine, Ser: 0.82 mg/dL (ref 0.40–1.20)
GFR: 66.07 mL/min (ref >60.00)
Glucose, Bld: 129 mg/dL — ABNORMAL HIGH (ref 70–99)
Potassium: 4.4 mEq/L (ref 3.5–5.1)
Sodium: 139 mEq/L (ref 135–145)
Total Bilirubin: 0.4 mg/dL (ref 0.2–1.2)
Total Protein: 7.1 g/dL (ref 6.0–8.3)

## 2021-03-08 LAB — HEMOGLOBIN A1C: Hgb A1c MFr Bld: 8.6 % — ABNORMAL HIGH (ref 4.6–6.5)

## 2021-03-08 LAB — CBC
HCT: 37.7 % (ref 36.0–46.0)
Hemoglobin: 12.4 g/dL (ref 12.0–15.0)
MCHC: 32.8 g/dL (ref 30.0–36.0)
MCV: 92.6 fl (ref 78.0–100.0)
Platelets: 218 10*3/uL (ref 150.0–400.0)
RBC: 4.07 Mil/uL (ref 3.87–5.11)
RDW: 14.1 % (ref 11.5–15.5)
WBC: 4.3 10*3/uL (ref 4.0–10.5)

## 2021-03-08 MED ORDER — LISINOPRIL 5 MG PO TABS: 5.0000 mg | ORAL_TABLET | Freq: Every day | ORAL | 3 refills | Status: DC

## 2021-03-08 MED ORDER — EMPAGLIFLOZIN 25 MG PO TABS: 25.0000 mg | ORAL_TABLET | Freq: Every day | ORAL | 3 refills | Status: DC

## 2021-03-08 NOTE — Assessment & Plan Note (Signed)
Checking lipid panel. Recent lipid panel not at goal and crestor was increased from 10 to 20 mg daily at that time. No recheck since. Will adjust as needed based on lipid panel for goal LDL <100 (<70 ideal).

## 2021-03-08 NOTE — Assessment & Plan Note (Addendum)
BP is at goal on lisinopril 5 mg daily. We discussed today that this is low dose and is more for prevention of nephropathy from diabetes. Goal BP 130/80 is met today. We will continue lisinopril 5 mg daily and this is prescribed today.  

## 2021-03-08 NOTE — Assessment & Plan Note (Addendum)
Needs eye exam and counseled about importance. Foot exam done today. Is taking jardiance 25 mg daily. Checking HgA1c, lipid panel and CMP and adjust as needed. She is taking ACE-I and statin and explained importance of these. Will continue and prescribe jardiance 25 mg daily.

## 2021-03-08 NOTE — Patient Instructions (Signed)
We will check the labs today and adjust the medicines if needed.

## 2021-03-08 NOTE — Progress Notes (Signed)
   Subjective:   Patient ID: Megan Wolfe, female    DOB: 06/29/1935, 85 y.o.   MRN: [id number]  HPI The patient is an 85 YO female coming in new for ongoing medical care and some questions about care.  PMH, [facility], social history reviewed and updated  Review of Systems  Constitutional: Negative.   HENT: Negative.    Eyes: Negative.   Respiratory:  Negative for cough, chest tightness and shortness of breath.   Cardiovascular:  Negative for chest pain, palpitations and leg swelling.  Gastrointestinal:  Negative for abdominal distention, abdominal pain, constipation, diarrhea, nausea and vomiting.  Musculoskeletal: Negative.   Skin: Negative.   Neurological: Negative.   Psychiatric/Behavioral: Negative.     Objective:  Physical Exam Constitutional:      Appearance: She is well-developed.  HENT:     Head: Normocephalic and atraumatic.  Cardiovascular:     Rate and Rhythm: Normal rate and regular rhythm.  Pulmonary:     Effort: Pulmonary effort is normal. No respiratory distress.     Breath sounds: Normal breath sounds. No wheezing or rales.  Abdominal:     General: Bowel sounds are normal. There is no distension.     Palpations: Abdomen is soft.     Tenderness: There is no abdominal tenderness. There is no rebound.  Musculoskeletal:     Cervical back: Normal range of motion.  Skin:    General: Skin is warm and dry.     Comments: Foot exam done  Neurological:     Mental Status: She is alert and oriented to person, place, and time.     Coordination: Coordination normal.    Vitals:   03/08/21 0806  BP: 118/80  Pulse: (!) 50  Resp: 18  SpO2: 97%  Weight: 128 lb 6.4 oz (58.2 kg)  Height: '5\' 4"'$  (1.626 m)    This visit occurred during the SARS-CoV-2 public health emergency.  Safety protocols were in place, including screening questions prior to the visit, additional usage of staff PPE, and extensive cleaning of exam room while observing appropriate contact time as  indicated for disinfecting solutions.   Assessment & Plan:

## 2021-03-11 ENCOUNTER — Encounter: Payer: Self-pay | Admitting: Internal Medicine

## 2021-03-25 NOTE — Telephone Encounter (Signed)
Made pt OV for 03/27/21 with Dr. Mitchel Honour for UTI.

## 2021-03-27 ENCOUNTER — Other Ambulatory Visit: Payer: Self-pay

## 2021-03-27 ENCOUNTER — Encounter: Payer: Self-pay | Admitting: Emergency Medicine

## 2021-03-27 ENCOUNTER — Ambulatory Visit (INDEPENDENT_AMBULATORY_CARE_PROVIDER_SITE_OTHER): Payer: Medicare Other | Admitting: Emergency Medicine

## 2021-03-27 VITALS — BP 140/82 | HR 76 | Temp 98.2°F | Ht 64.0 in | Wt 126.0 lb

## 2021-03-27 DIAGNOSIS — R300 Dysuria: Secondary | ICD-10-CM

## 2021-03-27 DIAGNOSIS — B999 Unspecified infectious disease: Secondary | ICD-10-CM | POA: Diagnosis not present

## 2021-03-27 LAB — POCT URINALYSIS DIP (MANUAL ENTRY)
Bilirubin, UA: NEGATIVE
Blood, UA: NEGATIVE
Glucose, UA: >=1000 mg/dL — AB
Ketones, POC UA: NEGATIVE mg/dL
Leukocytes, UA: NEGATIVE
Nitrite, UA: NEGATIVE
Protein Ur, POC: NEGATIVE mg/dL
Spec Grav, UA: 1.02 (ref 1.010–1.025)
Urobilinogen, UA: 0.2 E.U./dL
pH, UA: 6 (ref 5.0–8.0)

## 2021-03-27 MED ORDER — CEFUROXIME AXETIL 500 MG PO TABS: 500.0000 mg | ORAL_TABLET | Freq: Two times a day (BID) | ORAL | 0 refills | Status: AC

## 2021-03-27 NOTE — Progress Notes (Signed)
Megan Wolfe 85 y.o.   Chief Complaint  Patient presents with   Dysuria    Possible UTI    HISTORY OF PRESENT ILLNESS: Visit today for acute problem. This is a 85 y.o. female complaining of urinary frequency and urgency for 1 week. Had dental work done 2 to 3 weeks ago for which she took amoxicillin for close to a week. Denies nausea or vomiting.  Denies flank pain.  Denies gross hematuria.  No other associated symptoms. No other complaints or medical concerns today.  Urinary Frequency  Associated symptoms include frequency and urgency. Pertinent negatives include no chills, flank pain, hematuria, nausea or vomiting.    Prior to Admission medications   Medication Sig Start Date End Date Taking? Authorizing Provider  Accu-Chek Softclix Lancets lancets SMARTSIG:Topical 02/28/20  Yes [provider]  amoxicillin (AMOXIL) 500 MG capsule Take 500 mg by mouth 3 (three) times daily. 03/04/21  Yes [provider]  aspirin EC 81 MG tablet Take 324 mg by mouth once.   Yes [provider]  Blood Glucose Monitoring Suppl (ACCU-CHEK GUIDE ME) w/Device KIT  02/28/20  Yes [provider]  diclofenac Sodium (VOLTAREN) 1 % GEL Apply 2 g topically 4 (four) times daily. 11/28/20  Yes Charlott Rakes, MD  empagliflozin (JARDIANCE) 25 MG TABS tablet Take 1 tablet (25 mg total) by mouth daily before breakfast. 03/08/21  Yes Hoyt Koch, MD  lisinopril (ZESTRIL) 5 MG tablet Take 1 tablet (5 mg total) by mouth daily. 03/08/21  Yes Hoyt Koch, MD  rosuvastatin (CRESTOR) 20 MG tablet Take 1 tablet (20 mg total) by mouth daily. TAKE 1 TABLET BY MOUTH EVERY OTHER DAY. PLEASE SCHEDULE AN OFFICE VISIT 08/27/20  Yes Just, Laurita Quint, FNP  tetrahydrozoline 0.05 % ophthalmic solution Place 1 drop into both eyes as needed. For dry eyes.   Yes [provider]  TRUE METRIX BLOOD GLUCOSE TEST test strip TEST BLOOD GLUCOSE THREE TIMES A DAY. USE AS INSTRUCTED  08/27/19  Yes Forrest Moron, MD    No Known Allergies  Patient Active Problem List   Diagnosis Date Noted   Hyperlipidemia associated with type 2 diabetes mellitus ([place]) 03/08/2021   Essential hypertension 05/21/2020   Pseudophakia 11/04/2016   Type 2 diabetes with complication ([place]) 50/27/7412    Past Medical History:  Diagnosis Date   Diabetes mellitus without complication (HCC)    High cholesterol     Past Surgical History:  Procedure Laterality Date   ABDOMINAL HYSTERECTOMY      Social History   Socioeconomic History   Marital status: Divorced    Spouse name: Not on file   Number of children: 1   Years of education: Not on file   Highest education level: Master's degree (e.g., MA, MS, MEng, MEd, MSW, MBA)  Occupational History   Not on file  Tobacco Use   Smoking status: Never   Smokeless tobacco: Never  Vaping Use   Vaping Use: Never used  Substance and Sexual Activity   Alcohol use: Yes    Comment: red wine occasional    Drug use: No   Sexual activity: Never  Other Topics Concern   Not on file  Social History Narrative   Not on file   Social Determinants of Health   Financial Resource Strain: Not on file  Food Insecurity: Not on file  Transportation Needs: Not on file  Physical Activity: Not on file  Stress: Not on file  Social Connections: Not  on file  Intimate Partner Violence: Not on file    Family History  Problem Relation Age of Onset   Diabetes Mother    Diabetes Sister    Breast cancer Neg Hx      Review of Systems  Constitutional: Negative.  Negative for chills and fever.  HENT: Negative.  Negative for congestion and sore throat.   Respiratory: Negative.  Negative for cough and shortness of breath.   Cardiovascular:  Negative for chest pain and palpitations.  Gastrointestinal: Negative.  Negative for abdominal pain, diarrhea, nausea and vomiting.  Genitourinary:  Positive for frequency and urgency. Negative for dysuria, flank  pain and hematuria.  Skin: Negative.  Negative for rash.  Neurological:  Negative for dizziness and headaches.  All other systems reviewed and are negative.  Vitals:   03/27/21 1543  BP: 140/82  Pulse: 76  Temp: 98.2 F (36.8 C)  SpO2: 95%    Physical Exam Vitals reviewed.  Constitutional:      Appearance: Normal appearance.  HENT:     Head: Normocephalic.  Eyes:     Extraocular Movements: Extraocular movements intact.     Pupils: Pupils are equal, round, and reactive to light.  Cardiovascular:     Rate and Rhythm: Normal rate.  Pulmonary:     Effort: Pulmonary effort is normal.  Abdominal:     Palpations: Abdomen is soft.     Tenderness: There is no abdominal tenderness. There is no right CVA tenderness or left CVA tenderness.  Neurological:     General: No focal deficit present.     Mental Status: She is alert and oriented to person, place, and time.  Psychiatric:        Mood and Affect: Mood normal.        Behavior: Behavior normal.   Results for orders placed or performed in visit on 03/27/21 (from the past 24 hour(s))  POCT urinalysis dipstick     Status: Abnormal   Collection Time: 03/27/21  3:41 PM  Result Value Ref Range   Color, UA yellow yellow   Clarity, UA clear clear   Glucose, UA >=1,000 (A) negative mg/dL   Bilirubin, UA negative negative   Ketones, POC UA negative negative mg/dL   Spec Grav, UA 1.020 1.010 - 1.025   Blood, UA negative negative   pH, UA 6.0 5.0 - 8.0   Protein Ur, POC negative negative mg/dL   Urobilinogen, UA 0.2 0.2 or 1.0 E.U./dL   Nitrite, UA Negative Negative   Leukocytes, UA Negative Negative      ASSESSMENT & PLAN: [name] was seen today for urinary frequency.  Diagnoses and all orders for this visit:  Dysuria Comments: Partially treated UTI Orders: -     POCT urinalysis dipstick -     Urine Culture  Clinical infection -     cefUROXime (CEFTIN) 500 MG tablet; Take 1 tablet (500 mg total) by mouth 2 (two) times  daily with a meal for 7 days.  Patient Instructions  Urinary Tract Infection, Adult A urinary tract infection (UTI) is an infection of any part of the urinary tract. The urinary tract includes: The kidneys. The ureters. The bladder. The urethra. These organs make, store, and get rid of pee (urine) in the body. What are the causes? This infection is caused by germs (bacteria) in your genital area. These germs grow and cause swelling (inflammation) of your urinary tract. What increases the risk? The following factors may make you more likely to develop  this condition: Using a small, thin tube (catheter) to drain pee. Not being able to control when you pee or poop (incontinence). Being female. If you are female, these things can increase the risk: Using these methods to prevent pregnancy: A medicine that kills sperm (spermicide). A device that blocks sperm (diaphragm). Having low levels of a female hormone (estrogen). Being pregnant. You are more likely to develop this condition if: You have genes that add to your risk. You are sexually active. You take antibiotic medicines. You have trouble peeing because of: A prostate that is bigger than normal, if you are female. A blockage in the part of your body that drains pee from the bladder. A kidney stone. A nerve condition that affects your bladder. Not getting enough to drink. Not peeing often enough. You have other conditions, such as: Diabetes. A weak disease-fighting system (immune system). Sickle cell disease. Gout. Injury of the spine. What are the signs or symptoms? Symptoms of this condition include: Needing to pee right away. Peeing small amounts often. Pain or burning when peeing. Blood in the pee. Pee that smells bad or not like normal. Trouble peeing. Pee that is cloudy. Fluid coming from the vagina, if you are female. Pain in the belly or lower back. Other symptoms include: Vomiting. Not feeling  hungry. Feeling mixed up (confused). This may be the first symptom in older adults. Being tired and grouchy (irritable). A fever. Watery poop (diarrhea). How is this treated? Taking antibiotic medicine. Taking other medicines. Drinking enough water. In some cases, you may need to see a specialist. Follow these instructions at home: Medicines Take over-the-counter and prescription medicines only as told by your doctor. If you were prescribed an antibiotic medicine, take it as told by your doctor. Do not stop taking it even if you start to feel better. General instructions Make sure you: Pee until your bladder is empty. Do not hold pee for a long time. Empty your bladder after sex. Wipe from front to back after peeing or pooping if you are a female. Use each tissue one time when you wipe. Drink enough fluid to keep your pee pale yellow. Keep all follow-up visits. Contact a doctor if: You do not get better after 1-2 days. Your symptoms go away and then come back. Get help right away if: You have very bad back pain. You have very bad pain in your lower belly. You have a fever. You have chills. You feeling like you will vomit or you vomit. Summary A urinary tract infection (UTI) is an infection of any part of the urinary tract. This condition is caused by germs in your genital area. There are many risk factors for a UTI. Treatment includes antibiotic medicines. Drink enough fluid to keep your pee pale yellow. This information is not intended to replace advice given to you by your health care provider. Make sure you discuss any questions you have with your health care provider. Document Revised: 01/20/2020 Document Reviewed: 01/20/2020 Elsevier Patient Education  2022 [place], MD [facility]

## 2021-03-27 NOTE — Patient Instructions (Signed)

## 2021-03-28 LAB — URINE CULTURE

## 2021-04-22 ENCOUNTER — Ambulatory Visit: Payer: Medicare Other | Admitting: Internal Medicine

## 2021-05-01 ENCOUNTER — Telehealth: Payer: Self-pay | Admitting: Internal Medicine

## 2021-05-01 NOTE — Telephone Encounter (Signed)
1.Medication Requested: rosuvastatin (CRESTOR) 20 MG tablet  empagliflozin (JARDIANCE) 25 MG TABS tablet   2. Pharmacy (Name, Street, [place]): [id number] - [place] [address] [place].  Phone:  [phone] Fax:  [phone]   3. On Med List: yes  4. Last Visit with PCP: 10.05.22  5. Next visit date with PCP: 12.01.22   Agent: Please be advised that RX refills may take up to 3 business days. We ask that you follow-up with your pharmacy.

## 2021-05-02 ENCOUNTER — Other Ambulatory Visit: Payer: Self-pay | Admitting: Internal Medicine

## 2021-05-02 DIAGNOSIS — E785 Hyperlipidemia, unspecified: Secondary | ICD-10-CM

## 2021-05-02 NOTE — Telephone Encounter (Signed)
Unsure to refill medications due to patient not wanting to change her medication.

## 2021-05-06 NOTE — Telephone Encounter (Signed)
Okay to refill both.

## 2021-05-07 ENCOUNTER — Other Ambulatory Visit: Payer: Self-pay

## 2021-05-07 ENCOUNTER — Other Ambulatory Visit: Payer: Self-pay | Admitting: Internal Medicine

## 2021-05-07 DIAGNOSIS — E785 Hyperlipidemia, unspecified: Secondary | ICD-10-CM

## 2021-05-07 DIAGNOSIS — E119 Type 2 diabetes mellitus without complications: Secondary | ICD-10-CM

## 2021-05-07 MED ORDER — EMPAGLIFLOZIN 25 MG PO TABS: 25.0000 mg | ORAL_TABLET | Freq: Every day | ORAL | 3 refills | Status: AC

## 2021-05-07 MED ORDER — ROSUVASTATIN CALCIUM 20 MG PO TABS: 20.0000 mg | ORAL_TABLET | Freq: Every day | ORAL | 3 refills | Status: AC

## 2021-05-07 NOTE — Telephone Encounter (Signed)
Rx has been sent in. 

## 2021-05-23 ENCOUNTER — Ambulatory Visit (INDEPENDENT_AMBULATORY_CARE_PROVIDER_SITE_OTHER): Payer: Medicare Other | Admitting: Internal Medicine

## 2021-05-23 ENCOUNTER — Other Ambulatory Visit: Payer: Self-pay

## 2021-05-23 ENCOUNTER — Encounter: Payer: Self-pay | Admitting: Internal Medicine

## 2021-05-23 VITALS — BP 126/76 | HR 64 | Resp 18 | Ht 64.0 in | Wt 126.0 lb

## 2021-05-23 DIAGNOSIS — E1169 Type 2 diabetes mellitus with other specified complication: Secondary | ICD-10-CM | POA: Diagnosis not present

## 2021-05-23 DIAGNOSIS — I10 Essential (primary) hypertension: Secondary | ICD-10-CM | POA: Diagnosis not present

## 2021-05-23 DIAGNOSIS — E118 Type 2 diabetes mellitus with unspecified complications: Secondary | ICD-10-CM

## 2021-05-23 DIAGNOSIS — E785 Hyperlipidemia, unspecified: Secondary | ICD-10-CM

## 2021-05-23 LAB — LIPID PANEL
Cholesterol: 206 mg/dL — ABNORMAL HIGH (ref 0–200)
HDL: 60 mg/dL (ref >39.00)
LDL Cholesterol: 130 mg/dL — ABNORMAL HIGH (ref 0–99)
NonHDL: 145.72
Total CHOL/HDL Ratio: 3
Triglycerides: 79 mg/dL (ref 0.0–149.0)
VLDL: 15.8 mg/dL (ref 0.0–40.0)

## 2021-05-23 LAB — HEMOGLOBIN A1C: Hgb A1c MFr Bld: 7.9 % — ABNORMAL HIGH (ref 4.6–6.5)

## 2021-05-23 NOTE — Patient Instructions (Signed)
We are checking the sugar levels and cholesterol levels today.

## 2021-05-23 NOTE — Progress Notes (Signed)
   Subjective:   Patient ID: Megan Wolfe, female    DOB: 1936/02/26, 85 y.o.   MRN: [id number]  HPI The patient is an 85 YO female coming in for follow up medical conditions.   Review of Systems  Constitutional: Negative.   HENT: Negative.    Eyes: Negative.   Respiratory:  Negative for cough, chest tightness and shortness of breath.   Cardiovascular:  Negative for chest pain, palpitations and leg swelling.  Gastrointestinal:  Negative for abdominal distention, abdominal pain, constipation, diarrhea, nausea and vomiting.  Musculoskeletal: Negative.   Skin: Negative.   Neurological: Negative.   Psychiatric/Behavioral: Negative.     Objective:  Physical Exam Constitutional:      Appearance: She is well-developed.  HENT:     Head: Normocephalic and atraumatic.  Cardiovascular:     Rate and Rhythm: Normal rate and regular rhythm.  Pulmonary:     Effort: Pulmonary effort is normal. No respiratory distress.     Breath sounds: Normal breath sounds. No wheezing or rales.  Abdominal:     General: Bowel sounds are normal. There is no distension.     Palpations: Abdomen is soft.     Tenderness: There is no abdominal tenderness. There is no rebound.  Musculoskeletal:     Cervical back: Normal range of motion.  Skin:    General: Skin is warm and dry.  Neurological:     Mental Status: She is alert and oriented to person, place, and time.     Coordination: Coordination normal.    Vitals:   05/23/21 0955  BP: 126/76  Pulse: 64  Resp: 18  SpO2: 98%  Weight: 126 lb (57.2 kg)  Height: 5\' 4"  (1.626 m)    This visit occurred during the SARS-CoV-2 public health emergency.  Safety protocols were in place, including screening questions prior to the visit, additional usage of staff PPE, and extensive cleaning of exam room while observing appropriate contact time as indicated for disinfecting solutions.   Assessment & Plan:

## 2021-05-24 ENCOUNTER — Encounter: Payer: Self-pay | Admitting: Internal Medicine

## 2021-05-24 NOTE — Assessment & Plan Note (Signed)
We did increase crestor 20 mg to daily from every other day dosing about 2-3 months ago and rechecking lipid panel today. Change dosing as appropriate.

## 2021-05-24 NOTE — Assessment & Plan Note (Signed)
Previous HgA1c not at goal and she did not want to make medication changes. She has not been able to make many dietary changes since last time. Checking HgA1c. If still >8 we will need to make a change to medications. She does not like metformin and would not want to resume. Previously we had talked about actos as an option to add or rybelsus would be another option but this may be cost prohibitive. She is on ACE-I and statin.

## 2021-05-24 NOTE — Assessment & Plan Note (Signed)
BP at goal on lisinopril 5 mg daily. Recent labs without indication for change. Will continue.

## 2021-06-03 DIAGNOSIS — U071 COVID-19: Secondary | ICD-10-CM | POA: Diagnosis not present

## 2021-06-06 DIAGNOSIS — N760 Acute vaginitis: Secondary | ICD-10-CM | POA: Diagnosis not present

## 2021-06-06 DIAGNOSIS — E118 Type 2 diabetes mellitus with unspecified complications: Secondary | ICD-10-CM | POA: Diagnosis not present

## 2021-07-26 ENCOUNTER — Other Ambulatory Visit: Payer: Self-pay | Admitting: Internal Medicine

## 2021-07-26 DIAGNOSIS — Z13820 Encounter for screening for osteoporosis: Secondary | ICD-10-CM

## 2021-08-03 ENCOUNTER — Other Ambulatory Visit: Payer: Self-pay | Admitting: Internal Medicine

## 2021-08-03 DIAGNOSIS — E785 Hyperlipidemia, unspecified: Secondary | ICD-10-CM

## 2021-09-05 ENCOUNTER — Ambulatory Visit: Payer: Medicare Other | Admitting: Internal Medicine

## 2021-10-30 ENCOUNTER — Telehealth: Payer: Self-pay | Admitting: Internal Medicine

## 2021-10-30 NOTE — Telephone Encounter (Signed)
LVM for pt to rtn my call to schedule AWV with [facility]. Please schedule AWV if pt calls the office  

## 2021-12-12 ENCOUNTER — Telehealth: Payer: Self-pay | Admitting: Internal Medicine

## 2021-12-12 NOTE — Telephone Encounter (Signed)
Called patient to scheduled AWV with [facility]. She was not being kind to me. Stated she has not been see by PCP in a long time. Informed her that the las time she seen her PCP was 05/2021. She stated exactly now I know it's a scam, well I am not a patient of Dr. Sharlet Salina anymore. Informed patient that would take her off our list.

## 2021-12-17 ENCOUNTER — Other Ambulatory Visit: Payer: Medicare Other

## 2021-12-30 ENCOUNTER — Other Ambulatory Visit: Payer: Self-pay | Admitting: Internal Medicine

## 2021-12-30 ENCOUNTER — Ambulatory Visit
Admission: RE | Admit: 2021-12-30 | Discharge: 2021-12-30 | Disposition: A | Discharge status: Home / Self Care | Payer: Medicare Other | Source: Ambulatory Visit | Attending: Internal Medicine | Admitting: Internal Medicine

## 2021-12-30 DIAGNOSIS — N179 Acute kidney failure, unspecified: Secondary | ICD-10-CM

## 2022-01-01 ENCOUNTER — Other Ambulatory Visit: Payer: Self-pay | Admitting: Internal Medicine

## 2022-01-01 DIAGNOSIS — N179 Acute kidney failure, unspecified: Secondary | ICD-10-CM

## 2022-01-01 DIAGNOSIS — R809 Proteinuria, unspecified: Secondary | ICD-10-CM

## 2022-03-26 DIAGNOSIS — Z0289 Encounter for other administrative examinations: Secondary | ICD-10-CM | POA: Diagnosis not present

## 2022-03-26 DIAGNOSIS — E1169 Type 2 diabetes mellitus with other specified complication: Secondary | ICD-10-CM | POA: Diagnosis not present

## 2022-03-26 DIAGNOSIS — N179 Acute kidney failure, unspecified: Secondary | ICD-10-CM | POA: Diagnosis not present

## 2022-03-26 DIAGNOSIS — E1165 Type 2 diabetes mellitus with hyperglycemia: Secondary | ICD-10-CM | POA: Diagnosis not present

## 2022-03-26 DIAGNOSIS — E7800 Pure hypercholesterolemia, unspecified: Secondary | ICD-10-CM | POA: Diagnosis not present

## 2022-03-27 DIAGNOSIS — E119 Type 2 diabetes mellitus without complications: Secondary | ICD-10-CM | POA: Diagnosis not present

## 2022-03-27 DIAGNOSIS — H5213 Myopia, bilateral: Secondary | ICD-10-CM | POA: Diagnosis not present

## 2022-03-27 DIAGNOSIS — H52223 Regular astigmatism, bilateral: Secondary | ICD-10-CM | POA: Diagnosis not present

## 2022-04-01 DIAGNOSIS — R3915 Urgency of urination: Secondary | ICD-10-CM | POA: Diagnosis not present

## 2022-04-01 DIAGNOSIS — N8111 Cystocele, midline: Secondary | ICD-10-CM | POA: Diagnosis not present

## 2022-04-01 DIAGNOSIS — K5900 Constipation, unspecified: Secondary | ICD-10-CM | POA: Diagnosis not present

## 2022-04-14 DIAGNOSIS — N182 Chronic kidney disease, stage 2 (mild): Secondary | ICD-10-CM | POA: Diagnosis not present

## 2022-04-14 DIAGNOSIS — E1122 Type 2 diabetes mellitus with diabetic chronic kidney disease: Secondary | ICD-10-CM | POA: Diagnosis not present

## 2022-04-14 DIAGNOSIS — R768 Other specified abnormal immunological findings in serum: Secondary | ICD-10-CM | POA: Diagnosis not present

## 2022-04-14 DIAGNOSIS — I129 Hypertensive chronic kidney disease with stage 1 through stage 4 chronic kidney disease, or unspecified chronic kidney disease: Secondary | ICD-10-CM | POA: Diagnosis not present

## 2022-04-14 DIAGNOSIS — N179 Acute kidney failure, unspecified: Secondary | ICD-10-CM | POA: Diagnosis not present

## 2022-04-14 DIAGNOSIS — R809 Proteinuria, unspecified: Secondary | ICD-10-CM | POA: Diagnosis not present

## 2022-04-30 DIAGNOSIS — N179 Acute kidney failure, unspecified: Secondary | ICD-10-CM | POA: Diagnosis not present

## 2022-04-30 DIAGNOSIS — E7800 Pure hypercholesterolemia, unspecified: Secondary | ICD-10-CM | POA: Diagnosis not present

## 2022-04-30 DIAGNOSIS — E1169 Type 2 diabetes mellitus with other specified complication: Secondary | ICD-10-CM | POA: Diagnosis not present

## 2022-04-30 DIAGNOSIS — Z0289 Encounter for other administrative examinations: Secondary | ICD-10-CM | POA: Diagnosis not present

## 2022-04-30 DIAGNOSIS — I700 Atherosclerosis of aorta: Secondary | ICD-10-CM | POA: Diagnosis not present

## 2022-05-04 ENCOUNTER — Other Ambulatory Visit: Payer: Self-pay | Admitting: Internal Medicine

## 2022-05-04 DIAGNOSIS — I10 Essential (primary) hypertension: Secondary | ICD-10-CM

## 2022-06-06 ENCOUNTER — Other Ambulatory Visit: Payer: Self-pay | Admitting: Internal Medicine

## 2022-06-06 DIAGNOSIS — I10 Essential (primary) hypertension: Secondary | ICD-10-CM

## 2022-07-31 DIAGNOSIS — H6121 Impacted cerumen, right ear: Secondary | ICD-10-CM | POA: Diagnosis not present

## 2024-07-11 ENCOUNTER — Ambulatory Visit: Admitting: Podiatry

## 2024-07-11 ENCOUNTER — Encounter: Payer: Self-pay | Admitting: Podiatry

## 2024-07-11 DIAGNOSIS — M7751 Other enthesopathy of right foot: Secondary | ICD-10-CM | POA: Diagnosis not present

## 2024-07-11 DIAGNOSIS — M7752 Other enthesopathy of left foot: Secondary | ICD-10-CM | POA: Diagnosis not present

## 2024-07-11 MED ORDER — TRIAMCINOLONE ACETONIDE 10 MG/ML IJ SUSP
10.0000 mg | Freq: Once | INTRAMUSCULAR | Status: AC
  Administered 2024-07-11: 10 mg via INTRA_ARTICULAR

## 2024-07-11 NOTE — Progress Notes (Signed)
 Subjective:   Patient ID: Megan Wolfe, female   DOB: 89 y.o.   MRN: [id number]   HPI Patient presents with a lot of discomfort underneath the fifth metatarsal head left over right with fluid buildup with chronic element to the condition that worsened over the last number of years.  Patient has good digital perfusion well-oriented x 3 and does not smoke likes to be active   Review of Systems  All other systems reviewed and are negative.       Objective:  Physical Exam Vitals and nursing note reviewed.  Constitutional:      Appearance: She is well-developed.  Pulmonary:     Effort: Pulmonary effort is normal.  Musculoskeletal:        General: Normal range of motion.  Skin:    General: Skin is warm.  Neurological:     Mental Status: She is alert.     Neurovascular status found to be intact muscle strength was found to be adequate range of motion adequate exquisite discomfort underneath fifth metatarsal heads of both feet fluid buildup was noted around the joint surfaces pain with palpation.  Patient has good digital perfusion well oriented x 3 diabetes under good control     Assessment:  Inflammatory capsulitis fifth MPJ bilateral fluid buildup with lesion formation bilateral and structural deformity     Plan:  H&P reviewed reviewed conditions at this point I did do sterile prep I injected the plantar capsule bilateral 3 mg dexamethasone Kenalog  5 mg Xylocaine did sharp debridement of lesion no iatrogenic bleeding reappoint for routine care.  Did do courtesy debridement may ultimately require metatarsal head resection that I made her aware of today
